# Patient Record
Sex: Male | Born: 1975 | Race: White | Hispanic: No | Marital: Married | Smoking: Never smoker
Health system: Southern US, Community
[De-identification: ages and names within clinical notes are randomized; demographics above are authoritative.]

## PROBLEM LIST (undated history)

## (undated) DIAGNOSIS — R011 Cardiac murmur, unspecified: Secondary | ICD-10-CM

## (undated) DIAGNOSIS — B009 Herpesviral infection, unspecified: Secondary | ICD-10-CM

## (undated) HISTORY — DX: Cardiac murmur, unspecified: R01.1

## (undated) HISTORY — DX: Herpesviral infection, unspecified: B00.9

---

## 2003-03-26 ENCOUNTER — Encounter: Payer: Self-pay | Admitting: Family Medicine

## 2006-07-07 ENCOUNTER — Encounter: Payer: Self-pay | Admitting: Family Medicine

## 2007-08-25 ENCOUNTER — Ambulatory Visit: Payer: Self-pay | Admitting: Family Medicine

## 2007-08-25 DIAGNOSIS — N453 Epididymo-orchitis: Secondary | ICD-10-CM | POA: Insufficient documentation

## 2007-08-25 DIAGNOSIS — R531 Weakness: Secondary | ICD-10-CM | POA: Insufficient documentation

## 2007-08-25 DIAGNOSIS — R5381 Other malaise: Secondary | ICD-10-CM

## 2007-08-25 DIAGNOSIS — R5383 Other fatigue: Secondary | ICD-10-CM | POA: Insufficient documentation

## 2007-08-25 DIAGNOSIS — R300 Dysuria: Secondary | ICD-10-CM | POA: Insufficient documentation

## 2007-08-25 LAB — CONVERTED CEMR LAB
Casts: 0 /lpf
Nitrite: NEGATIVE
Urine crystals, microscopic: 0 /hpf
Urobilinogen, UA: NEGATIVE
WBC Urine, dipstick: NEGATIVE
Yeast, UA: 0
pH: 7

## 2007-08-29 ENCOUNTER — Ambulatory Visit: Payer: Self-pay | Admitting: Family Medicine

## 2007-08-30 ENCOUNTER — Encounter: Payer: Self-pay | Admitting: Family Medicine

## 2007-08-31 LAB — CONVERTED CEMR LAB
Alkaline Phosphatase: 44 units/L (ref 39–117)
BUN: 20 mg/dL (ref 6–23)
Basophils Relative: 0 % (ref 0.0–1.0)
Bilirubin, Direct: 0.1 mg/dL (ref 0.0–0.3)
CO2: 28 meq/L (ref 19–32)
Cholesterol: 160 mg/dL (ref 0–200)
GFR calc Af Amer: 100 mL/min
Glucose, Bld: 95 mg/dL (ref 70–99)
HDL: 35.5 mg/dL — ABNORMAL LOW (ref >39.0)
Hemoglobin: 15.2 g/dL (ref 13.0–17.0)
Lymphocytes Relative: 37.4 % (ref 12.0–46.0)
MCHC: 35 g/dL (ref 30.0–36.0)
MCV: 91.1 fL (ref 78.0–100.0)
Monocytes Absolute: 0.4 10*3/uL (ref 0.2–0.7)
Monocytes Relative: 7.6 % (ref 3.0–11.0)
Neutro Abs: 2.8 10*3/uL (ref 1.4–7.7)
Potassium: 4.3 meq/L (ref 3.5–5.1)
TSH: 1.61 microintl units/mL (ref 0.35–5.50)
Total Protein: 6.9 g/dL (ref 6.0–8.3)
VLDL: 10 mg/dL (ref 0–40)
Vitamin B-12: 246 pg/mL (ref 211–911)

## 2007-09-01 ENCOUNTER — Ambulatory Visit: Payer: Self-pay | Admitting: Family Medicine

## 2007-09-02 ENCOUNTER — Encounter: Payer: Self-pay | Admitting: Family Medicine

## 2008-07-18 ENCOUNTER — Encounter: Payer: Self-pay | Admitting: Family Medicine

## 2008-08-06 ENCOUNTER — Encounter: Payer: Self-pay | Admitting: Family Medicine

## 2010-06-05 ENCOUNTER — Ambulatory Visit: Payer: Self-pay | Admitting: Family Medicine

## 2010-06-05 DIAGNOSIS — F329 Major depressive disorder, single episode, unspecified: Secondary | ICD-10-CM | POA: Insufficient documentation

## 2010-06-05 DIAGNOSIS — F3289 Other specified depressive episodes: Secondary | ICD-10-CM | POA: Insufficient documentation

## 2010-07-09 ENCOUNTER — Ambulatory Visit: Payer: Self-pay | Admitting: Family Medicine

## 2010-11-10 NOTE — Assessment & Plan Note (Signed)
Summary: STRESS/CLE   Vital Signs:  Patient profile:   35 year old male Height:      72 inches Weight:      191.6 pounds BMI:     26.08 Temp:     98.5 degrees F oral Pulse rate:   76 / minute Pulse rhythm:   regular BP sitting:   120 / 78  (left arm) Cuff size:   regular  Vitals Entered By: Benny Lennert CMA Duncan Dull) (June 05, 2010 8:55 AM)  History of Present Illness: Chief complaint stress  Feels depressed some. About two years ago lost job at [employer]. Since then and now staying home with three kids. Almost getting a divorce. Crying a lot. Does not want to do anything. Does not really care. Slowed cognitively. Poor recall. Low self esteem. Massage therapost. Not sleeping well at all. Lately getting asleep is very hard. Not working out much.   A couple of drinks a month.   Was ranked eight in the world in th epast. [place].   Has an appointment to see a psychologist next week.   Allergies (verified): No Known Drug Allergies  Past History:  Past medical, surgical, family and social histories (including risk factors) reviewed, and no changes noted (except as noted below).  Past Surgical History: Reviewed history from 08/25/2007 and no changes required. barium swallow nml  2003 CT of abdomen nml 2003  Family History: Reviewed history from 08/25/2007 and no changes required. father age 44 carotid stenosis mother age 82 high chol, HTN, basal cell Ca siblings: brother MI age 41 Family History of CAD Male 1st degree relative <50 MGM: Alzheimer's MGF: heart disease PGF: DM PGM: heallthy no cancer  Social History: Reviewed history from 08/25/2007 and no changes required. 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  We are going to start an antidepressant. He Artie is set up with a psychologist in counseling. Follow up in one month.  His updated medication list for this problem includes:    Citalopram Hydrobromide 20 Mg Tabs (Citalopram hydrobromide) .Marland Kitchen... 1 by mouth daily  Complete Medication List: 1)  Citalopram Hydrobromide 20 Mg Tabs (Citalopram hydrobromide) .Marland Kitchen.. 1 by mouth daily  Patient Instructions: 1)  f/u 1 month (with Dr. Patsy Lager) Prescriptions: CITALOPRAM HYDROBROMIDE 20 MG TABS (CITALOPRAM HYDROBROMIDE) 1 by mouth daily  #30 x 5   Entered and Authorized by:   Hannah Beat MD   Signed by:   Hannah Beat MD on 06/05/2010   Method used:   [facility] to        [facility]  [address] (retail)       [address]       [place], [place]  [address]       Ph: [phone] or [phone]       Fax: [phone]   RxID:   [id number]   Prior Medications (reviewed today): None Current Allergies (reviewed today): No known allergies

## 2010-11-10 NOTE — Assessment & Plan Note (Signed)
Summary: 1 M F/U DLO   Vital Signs:  Patient profile:   35 year old male Height:      72 inches Weight:      192.0 pounds BMI:     26.13 Temp:     98.2 degrees F oral Pulse rate:   76 / minute Pulse rhythm:   regular BP sitting:   120 / 80  (left arm) Cuff size:   regular  Vitals Entered By: Benny Lennert CMA Duncan Dull) (July 09, 2010 9:25 AM)  History of Present Illness: Chief complaint 1 month follow up   f/u dep:  sleeping through the night does feel sleepy some during the day    Allergies (verified): No Known Drug Allergies  Past History:  Past medical, surgical, family and social histories (including risk factors) reviewed, and no changes noted (except as noted below).  Past Surgical History: Reviewed history from 08/25/2007 and no changes required. barium swallow nml  2003 CT of abdomen nml 2003  Family History: Reviewed history from 08/25/2007 and no changes required. father age 79 carotid stenosis mother age 19 high chol, HTN, basal cell Ca siblings: brother MI age 55 Family History of CAD Male 1st degree relative <50 MGM: Alzheimer's MGF: heart disease PGF: DM PGM: heallthy no cancer  Social History: Reviewed history from 08/25/2007 and no changes required. 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 Getting sleepy in AM. No counselling. Trial of lower Celexa dosing, 10 mg based on SE, sleep. Patient encouraged about changes. No si/hi. More directed. Less irritable.  His updated medication list for this problem includes:    Citalopram Hydrobromide  10 Mg Tabs (Citalopram hydrobromide) .Marland Kitchen... Take 1 tab by mouth daily  Complete Medication List: 1)  Citalopram Hydrobromide 10 Mg Tabs (Citalopram hydrobromide) .... Take 1 tab by mouth daily  Patient Instructions: 1)  f/u at least yearly Prescriptions: CITALOPRAM HYDROBROMIDE 10 MG  TABS (CITALOPRAM HYDROBROMIDE) Take 1 tab by mouth daily  #30 x 11   Entered and Authorized by:   Hannah Beat MD   Signed by:   Hannah Beat MD on 07/09/2010   Method used:   Print then Give to Patient   RxID:   [id number]   Current Allergies (reviewed today): No known allergies

## 2012-11-15 ENCOUNTER — Ambulatory Visit
Admission: RE | Admit: 2012-11-15 | Discharge: 2012-11-15 | Disposition: A | Discharge status: Home / Self Care | Payer: BC Managed Care – PPO | Source: Ambulatory Visit | Attending: Nurse Practitioner | Admitting: Nurse Practitioner

## 2012-11-15 ENCOUNTER — Other Ambulatory Visit: Payer: Self-pay | Admitting: Nurse Practitioner

## 2012-11-15 DIAGNOSIS — R05 Cough: Secondary | ICD-10-CM

## 2020-04-04 ENCOUNTER — Telehealth: Payer: Self-pay

## 2020-04-04 NOTE — Telephone Encounter (Signed)
Pt called to advise he still has not heard from [facility]. We have no other place to send him and they are back logged. Please advise if pt can have pain med or advise on comfort wile he waits. KH

## 2020-04-25 ENCOUNTER — Emergency Department (HOSPITAL_COMMUNITY): Payer: BC Managed Care – PPO

## 2020-04-25 ENCOUNTER — Other Ambulatory Visit: Payer: Self-pay

## 2020-04-25 ENCOUNTER — Encounter (HOSPITAL_COMMUNITY): Payer: Self-pay

## 2020-04-25 DIAGNOSIS — Z20822 Contact with and (suspected) exposure to covid-19: Secondary | ICD-10-CM | POA: Diagnosis not present

## 2020-04-25 DIAGNOSIS — R079 Chest pain, unspecified: Secondary | ICD-10-CM | POA: Insufficient documentation

## 2020-04-25 DIAGNOSIS — R1310 Dysphagia, unspecified: Secondary | ICD-10-CM | POA: Diagnosis not present

## 2020-04-25 NOTE — ED Triage Notes (Signed)
Pt sts eating raibs last night and has bone or cartilage stuck in throat. Pain in throat and has moved to lungs per pt.

## 2020-04-26 ENCOUNTER — Emergency Department (HOSPITAL_COMMUNITY)
Admission: EM | Admit: 2020-04-26 | Discharge: 2020-04-26 | Disposition: A | Discharge status: Home / Self Care | Payer: BC Managed Care – PPO | Attending: Emergency Medicine | Admitting: Emergency Medicine

## 2020-04-26 ENCOUNTER — Emergency Department (HOSPITAL_COMMUNITY): Payer: BC Managed Care – PPO

## 2020-04-26 ENCOUNTER — Encounter (HOSPITAL_COMMUNITY): Payer: Self-pay

## 2020-04-26 DIAGNOSIS — R1319 Other dysphagia: Secondary | ICD-10-CM

## 2020-04-26 LAB — CBC WITH DIFFERENTIAL/PLATELET
Abs Immature Granulocytes: 0.05 10*3/uL (ref 0.00–0.07)
Basophils Absolute: 0 10*3/uL (ref 0.0–0.1)
Basophils Relative: 0 %
Eosinophils Absolute: 0.2 10*3/uL (ref 0.0–0.5)
Eosinophils Relative: 2 %
HCT: 45.8 % (ref 39.0–52.0)
Hemoglobin: 15.2 g/dL (ref 13.0–17.0)
Immature Granulocytes: 1 %
Lymphocytes Relative: 26 %
Lymphs Abs: 2.4 10*3/uL (ref 0.7–4.0)
MCH: 30 pg (ref 26.0–34.0)
MCHC: 33.2 g/dL (ref 30.0–36.0)
MCV: 90.5 fL (ref 80.0–100.0)
Monocytes Absolute: 0.8 10*3/uL (ref 0.1–1.0)
Monocytes Relative: 8 %
Neutro Abs: 5.7 10*3/uL (ref 1.7–7.7)
Neutrophils Relative %: 63 %
Platelets: 219 10*3/uL (ref 150–400)
RBC: 5.06 MIL/uL (ref 4.22–5.81)
RDW: 13.3 % (ref 11.5–15.5)
WBC: 9.2 10*3/uL (ref 4.0–10.5)
nRBC: 0 % (ref 0.0–0.2)

## 2020-04-26 LAB — BASIC METABOLIC PANEL
Anion gap: 11 (ref 5–15)
BUN: 17 mg/dL (ref 6–20)
CO2: 26 mmol/L (ref 22–32)
Calcium: 9.3 mg/dL (ref 8.9–10.3)
Chloride: 101 mmol/L (ref 98–111)
Creatinine, Ser: 0.94 mg/dL (ref 0.61–1.24)
GFR calc Af Amer: >60 mL/min (ref >60)
GFR calc non Af Amer: >60 mL/min (ref >60)
Glucose, Bld: 99 mg/dL (ref 70–99)
Potassium: 3.7 mmol/L (ref 3.5–5.1)
Sodium: 138 mmol/L (ref 135–145)

## 2020-04-26 LAB — SARS CORONAVIRUS 2 BY RT PCR (HOSPITAL ORDER, PERFORMED IN ~~LOC~~ HOSPITAL LAB): SARS Coronavirus 2: NEGATIVE

## 2020-04-26 SURGERY — ESOPHAGOGASTRODUODENOSCOPY (EGD) WITH PROPOFOL
Anesthesia: Monitor Anesthesia Care

## 2020-04-26 MED ORDER — ALUM & MAG HYDROXIDE-SIMETH 200-200-20 MG/5ML PO SUSP
30.0000 mL | Freq: Once | ORAL | Status: AC
  Administered 2020-04-26: 30 mL via ORAL
  Filled 2020-04-26: qty 30

## 2020-04-26 MED ORDER — LIDOCAINE VISCOUS HCL 2 % MT SOLN
15.0000 mL | Freq: Once | OROMUCOSAL | Status: AC
  Administered 2020-04-26: 15 mL via ORAL
  Filled 2020-04-26: qty 15

## 2020-04-26 MED ORDER — IOHEXOL 300 MG/ML  SOLN
30.0000 mL | Freq: Once | INTRAMUSCULAR | Status: AC | PRN
  Administered 2020-04-26: 30 mL via ORAL

## 2020-04-26 MED ORDER — GLUCAGON HCL RDNA (DIAGNOSTIC) 1 MG IJ SOLR
1.0000 mg | Freq: Once | INTRAMUSCULAR | Status: DC | PRN
  Filled 2020-04-26: qty 1

## 2020-04-26 NOTE — Discharge Instructions (Addendum)
You have opted not to have the recommended endoscopy to verify there is no foreign body (ie bone fragment) embedded in the esophageal wall. We have talked about the potential complications including infection, and/or erosion/perforation of the esophagus, which could become life threatening if not treated. Please know you are welcome to return at any time with any concern. You are encouraged to follow up with Dr. Matthias Hughs in his office if your pain continues longer than expected from a minor esophageal injury. Recommend a diet of liquids and soft foods, that can be advanced as symptoms improve.

## 2020-04-26 NOTE — ED Provider Notes (Addendum)
[facility]-EMERGENCY DEPT Provider Note   CSN: [id number] Arrival date & time: 04/25/20  2125     History Chief Complaint  Patient presents with  . Foreign Body In Throat    Adam Kaiser is a 44 y.o. male.  Patient without significant medical history presents with chest pain after swallowing a piece of bone or cartilage while eating ribs night before last (04/23/20). He gagged and choked at the time but did not regurgitate anything. He has been able to drink fluids but has not eaten. He did try to eat some bread but feels it became lodge. No vomiting. His pain has gotten progressively worse throughout yesterday, with intense pain when coughing. No shortness of breath or pain with breathing.   The history is provided by the patient. No language interpreter was used.       History reviewed. No pertinent past medical history.  Patient Active Problem List   Diagnosis Date Noted  . DEPRESSIVE DISORDER 06/05/2010  . EPIDIDYMITIS, LEFT 08/25/2007  . FATIGUE, CHRONIC 08/25/2007  . DYSURIA 08/25/2007    History reviewed. No pertinent surgical history.     No family history on file.  Social History   Tobacco Use  . Smoking status: Not on file  Substance Use Topics  . Alcohol use: Not on file  . Drug use: Not on file    Home Medications Prior to Admission medications   Not on File    Allergies    Patient has no known allergies.  Review of Systems   Review of Systems  Constitutional: Negative for chills and fever.  HENT: Negative.  Negative for sore throat and trouble swallowing.   Respiratory: Negative.  Negative for shortness of breath.   Cardiovascular: Positive for chest pain.  Gastrointestinal: Negative.  Negative for abdominal pain, nausea and vomiting.  Musculoskeletal: Negative.   Skin: Negative.   Neurological: Negative.     Physical Exam Updated Vital Signs BP (!) 150/99 (BP Location: Left Arm)   Pulse 69   Temp 97.7 F (36.5  C) (Oral)   Resp 20   Ht 6' (1.829 m)   Wt 104.3 kg   SpO2 97%   BMI 31.19 kg/m   Physical Exam Vitals and nursing note reviewed.  Constitutional:      Appearance: He is well-developed.  HENT:     Head: Normocephalic.  Cardiovascular:     Rate and Rhythm: Normal rate and regular rhythm.  Pulmonary:     Effort: Pulmonary effort is normal.     Breath sounds: Normal breath sounds. No stridor. No wheezing, rhonchi or rales.  Chest:     Chest wall: No tenderness.  Abdominal:     General: Bowel sounds are normal.     Palpations: Abdomen is soft.     Tenderness: There is no abdominal tenderness. There is no guarding or rebound.  Musculoskeletal:        General: Normal range of motion.     Cervical back: Normal range of motion and neck supple.  Skin:    General: Skin is warm and dry.     Findings: No rash.  Neurological:     Mental Status: He is alert and oriented to person, place, and time.     ED Results / Procedures / Treatments   Labs (all labs ordered are listed, but only abnormal results are displayed) Labs Reviewed - No data to display  EKG None  Radiology DG Neck Soft Tissue  Result Date: 04/25/2020  CLINICAL DATA:  Short of breath, postprandial sternal and neck pain for 1 day EXAM: NECK SOFT TISSUES - 1+ VIEW COMPARISON:  None. FINDINGS: Frontal and lateral views of the soft tissues of the neck demonstrate wide patency of the airway. The epiglottis is unremarkable. The prevertebral and retropharyngeal soft tissues are normal. There are no radiopaque foreign bodies. Bony structures are normal. IMPRESSION: 1. Unremarkable soft tissues of the neck. No radiopaque foreign body. Electronically Signed   By: Sharlet Salina M.D.   On: 04/25/2020 22:11   DG Chest 2 View  Result Date: 04/25/2020 CLINICAL DATA:  Short of breath, sternal postprandial pain EXAM: CHEST - 2 VIEW COMPARISON:  11/15/2012 FINDINGS: Frontal and lateral views of the chest demonstrate an unremarkable  cardiac silhouette. No airspace disease, effusion, or pneumothorax. No radiopaque foreign bodies. No acute bony abnormalities. IMPRESSION: 1. No acute intrathoracic process. Electronically Signed   By: Sharlet Salina M.D.   On: 04/25/2020 22:10    Procedures Procedures (including critical care time)  Medications Ordered in ED Medications  alum & mag hydroxide-simeth (MAALOX/MYLANTA) 200-200-20 MG/5ML suspension 30 mL (has no administration in time range)    And  lidocaine (XYLOCAINE) 2 % viscous mouth solution 15 mL (has no administration in time range)    ED Course  I have reviewed the triage vital signs and the nursing notes.  Pertinent labs & imaging results that were available during my care of the patient were reviewed by me and considered in my medical decision making (see chart for details).    MDM Rules/Calculators/A&P                          Patient to ED with chest pain and the feeling of a retained swallowed foreign body.  He is uncomfortable appearing with cough. No breathing difficulty. No nausea or vomiting. Symptoms started 24 hours ago and he has been able to swallow pasta without vomiting, but with pain. CXR and soft tissue neck imaging is unremarkable.   GI cocktail provided without significant relief.  DDx: esophageal perforation vs retained bone fragment vs luminal esophageal injury. Will discuss with GI on call, Dr. Matthias Hughs.   CT chest recommended by GI to evaluate for perforation, FB. Per radiology, will not use IV contrast but oral CM is recommended. This study does not show any evidence of perforation, esophageal mass or FB.   Per Dr. Matthias Hughs, will get EGD to insure no lodged bone fragment. Given ss/sxs, CT results, it is felt unlikely, however, EGD indicated because of the potential complications from retained or embedded FB. Patient agrees to have the procedure performed. COVID test pending. Will call Dr. Matthias Hughs when test is resulted.   Dr. Matthias Hughs aware of  negative COVID test and will be in with his endoscopy team to perform EGD.   ADDENDUM: on final recheck and update with the patient, he states he no longer wants to have to EGD done this morning. I reiterated that the study is recommended as the potential complications from an embedded FB, including infection and/or erosion and perforation of the esophagus, are significant. The patient reports he understands. Discussed that follow up with GI/Dr. Buccini is available to him in the event his pain continues, and return to the ED for severe pain, fever, SOB, or new concern is also an option open to him.     Final Clinical Impression(s) / ED Diagnoses Final diagnoses:  None   1. Dysphagia  Rx /  DC Orders ED Discharge Orders    None       Elpidio Anis, Cordelia Poche 04/26/20 0719    Ward, Layla Maw, DO 04/26/20 0720    Elpidio Anis, PA-C 04/26/20 0732    Ward, Layla Maw, DO 04/26/20 [phone]

## 2021-01-31 ENCOUNTER — Encounter (HOSPITAL_COMMUNITY): Payer: Self-pay

## 2021-01-31 ENCOUNTER — Emergency Department (HOSPITAL_COMMUNITY): Payer: BC Managed Care – PPO

## 2021-01-31 ENCOUNTER — Emergency Department (HOSPITAL_COMMUNITY)
Admission: EM | Admit: 2021-01-31 | Discharge: 2021-01-31 | Disposition: A | Discharge status: Home / Self Care | Payer: BC Managed Care – PPO | Attending: Emergency Medicine | Admitting: Emergency Medicine

## 2021-01-31 DIAGNOSIS — J029 Acute pharyngitis, unspecified: Secondary | ICD-10-CM | POA: Insufficient documentation

## 2021-01-31 DIAGNOSIS — Z20822 Contact with and (suspected) exposure to covid-19: Secondary | ICD-10-CM | POA: Insufficient documentation

## 2021-01-31 LAB — COMPREHENSIVE METABOLIC PANEL
ALT: 31 U/L (ref 0–44)
AST: 21 U/L (ref 15–41)
Albumin: 4.5 g/dL (ref 3.5–5.0)
Alkaline Phosphatase: 43 U/L (ref 38–126)
Anion gap: 11 (ref 5–15)
BUN: 23 mg/dL — ABNORMAL HIGH (ref 6–20)
CO2: 22 mmol/L (ref 22–32)
Calcium: 9.4 mg/dL (ref 8.9–10.3)
Chloride: 110 mmol/L (ref 98–111)
Creatinine, Ser: 1.08 mg/dL (ref 0.61–1.24)
GFR, Estimated: >60 mL/min (ref >60)
Glucose, Bld: 97 mg/dL (ref 70–99)
Potassium: 4.1 mmol/L (ref 3.5–5.1)
Sodium: 143 mmol/L (ref 135–145)
Total Bilirubin: 0.7 mg/dL (ref 0.3–1.2)
Total Protein: 7.8 g/dL (ref 6.5–8.1)

## 2021-01-31 LAB — CBC WITH DIFFERENTIAL/PLATELET
Abs Immature Granulocytes: 0.05 10*3/uL (ref 0.00–0.07)
Basophils Absolute: 0.1 10*3/uL (ref 0.0–0.1)
Basophils Relative: 0 %
Eosinophils Absolute: 0.1 10*3/uL (ref 0.0–0.5)
Eosinophils Relative: 1 %
HCT: 43.9 % (ref 39.0–52.0)
Hemoglobin: 14.9 g/dL (ref 13.0–17.0)
Immature Granulocytes: 0 %
Lymphocytes Relative: 21 %
Lymphs Abs: 2.5 10*3/uL (ref 0.7–4.0)
MCH: 30.2 pg (ref 26.0–34.0)
MCHC: 33.9 g/dL (ref 30.0–36.0)
MCV: 88.9 fL (ref 80.0–100.0)
Monocytes Absolute: 0.8 10*3/uL (ref 0.1–1.0)
Monocytes Relative: 7 %
Neutro Abs: 8.2 10*3/uL — ABNORMAL HIGH (ref 1.7–7.7)
Neutrophils Relative %: 71 %
Platelets: 223 10*3/uL (ref 150–400)
RBC: 4.94 MIL/uL (ref 4.22–5.81)
RDW: 13.1 % (ref 11.5–15.5)
WBC: 11.7 10*3/uL — ABNORMAL HIGH (ref 4.0–10.5)
nRBC: 0 % (ref 0.0–0.2)

## 2021-01-31 LAB — RESP PANEL BY RT-PCR (FLU A&B, COVID) ARPGX2
Influenza A by PCR: NEGATIVE
Influenza B by PCR: NEGATIVE
SARS Coronavirus 2 by RT PCR: NEGATIVE

## 2021-01-31 LAB — GROUP A STREP BY PCR: Group A Strep by PCR: NOT DETECTED

## 2021-01-31 MED ORDER — IOHEXOL 300 MG/ML  SOLN
75.0000 mL | Freq: Once | INTRAMUSCULAR | Status: AC | PRN
  Administered 2021-01-31: 75 mL via INTRAVENOUS

## 2021-01-31 MED ORDER — PROCHLORPERAZINE EDISYLATE 10 MG/2ML IJ SOLN
10.0000 mg | Freq: Once | INTRAMUSCULAR | Status: AC
  Administered 2021-01-31: 10 mg via INTRAVENOUS
  Filled 2021-01-31: qty 2

## 2021-01-31 MED ORDER — LIDOCAINE VISCOUS HCL 2 % MT SOLN
15.0000 mL | Freq: Once | OROMUCOSAL | Status: AC
  Administered 2021-01-31: 15 mL via OROMUCOSAL
  Filled 2021-01-31: qty 15

## 2021-01-31 MED ORDER — KETOROLAC TROMETHAMINE 30 MG/ML IJ SOLN
30.0000 mg | Freq: Once | INTRAMUSCULAR | Status: AC
  Administered 2021-01-31: 30 mg via INTRAVENOUS
  Filled 2021-01-31: qty 1

## 2021-01-31 MED ORDER — DIPHENHYDRAMINE HCL 50 MG/ML IJ SOLN
25.0000 mg | Freq: Once | INTRAMUSCULAR | Status: AC
  Administered 2021-01-31: 25 mg via INTRAVENOUS
  Filled 2021-01-31: qty 1

## 2021-01-31 MED ORDER — DEXAMETHASONE SODIUM PHOSPHATE 10 MG/ML IJ SOLN
16.0000 mg | Freq: Once | INTRAMUSCULAR | Status: AC
  Administered 2021-01-31: 16 mg via INTRAVENOUS
  Filled 2021-01-31: qty 2

## 2021-01-31 NOTE — ED Provider Notes (Signed)
[facility]-EMERGENCY DEPT Provider Note   CSN: [id number] Arrival date & time: 01/31/21  1514     History Chief Complaint  Patient presents with  . Dysphagia    Adam Kaiser is a 45 y.o. male.  HPI      45yo male with no significant medical history presents with concern for sore throat, neck pain, painful swallowing, and shortness of breath. Reports that he began to have sensation of swelling and stiffness across his anterior neck over the last couple days, and today developed more severe sore throat.  Reports severe, 10 out of 10 pain with swallowing, and also with neck movements which she has never experienced before.  He is not drooling, does not feel that his voice is significantly different.  He does feel that he has some shortness of breath.  He denies known fevers, nausea, vomiting, rash with the exception of one small area on his calf which was present yesterday and resolved.  Denies any history of allergies or known exposures.   History reviewed. No pertinent past medical history.  Patient Active Problem List   Diagnosis Date Noted  . DEPRESSIVE DISORDER 06/05/2010  . EPIDIDYMITIS, LEFT 08/25/2007  . FATIGUE, CHRONIC 08/25/2007  . DYSURIA 08/25/2007    History reviewed. No pertinent surgical history.     History reviewed. No pertinent family history.  Social History   Tobacco Use  . Smoking status: Never Smoker  . Smokeless tobacco: Never Used    Home Medications Prior to Admission medications   Not on File    Allergies    Patient has no known allergies.  Review of Systems   Review of Systems  Constitutional: Negative for fever.  HENT: Positive for sore throat and trouble swallowing. Negative for congestion and voice change.   Eyes: Negative for visual disturbance.  Respiratory: Positive for shortness of breath.   Cardiovascular: Negative for chest pain.  Gastrointestinal: Negative for abdominal pain, nausea and vomiting.   Genitourinary: Negative for difficulty urinating.  Musculoskeletal: Positive for neck pain and neck stiffness. Negative for back pain.  Skin: Negative for rash.  Neurological: Positive for headaches. Negative for syncope.    Physical Exam Updated Vital Signs BP 131/85   Pulse (!) 104   Temp 98.1 F (36.7 C)   Resp 16   SpO2 94%   Physical Exam Vitals and nursing note reviewed.  Constitutional:      General: He is not in acute distress.    Appearance: He is well-developed. He is not diaphoretic.  HENT:     Head: Normocephalic and atraumatic.     Mouth/Throat:     Mouth: Mucous membranes are moist.     Pharynx: No oropharyngeal exudate or posterior oropharyngeal erythema.  Eyes:     Conjunctiva/sclera: Conjunctivae normal.  Cardiovascular:     Rate and Rhythm: Normal rate and regular rhythm.     Heart sounds: Normal heart sounds. No murmur heard. No friction rub. No gallop.   Pulmonary:     Effort: Pulmonary effort is normal. No respiratory distress.     Breath sounds: Normal breath sounds. No stridor. No wheezing or rales.  Abdominal:     General: There is no distension.     Palpations: Abdomen is soft.     Tenderness: There is no abdominal tenderness. There is no guarding.  Musculoskeletal:     Cervical back: Normal range of motion.  Skin:    General: Skin is warm and dry.  Neurological:  Mental Status: He is alert and oriented to person, place, and time.     ED Results / Procedures / Treatments   Labs (all labs ordered are listed, but only abnormal results are displayed) Labs Reviewed  CBC WITH DIFFERENTIAL/PLATELET - Abnormal; Notable for the following components:      Result Value   WBC 11.7 (*)    Neutro Abs 8.2 (*)    All other components within normal limits  COMPREHENSIVE METABOLIC PANEL - Abnormal; Notable for the following components:   BUN 23 (*)    All other components within normal limits  GROUP A STREP BY PCR  RESP PANEL BY RT-PCR (FLU  A&B, COVID) [id number]    EKG None  Radiology CT Soft Tissue Neck W Contrast  Result Date: 01/31/2021 CLINICAL DATA:  Difficulty swallowing. Speaking in partial sentences. EXAM: CT NECK WITH CONTRAST TECHNIQUE: Multidetector CT imaging of the neck was performed using the standard protocol following the bolus administration of intravenous contrast. CONTRAST:  34mL OMNIPAQUE IOHEXOL 300 MG/ML  SOLN COMPARISON:  None. FINDINGS: Pharynx and larynx: No evidence of mass or swelling. Mild-to-moderate symmetric prominence of the palatine tonsils. Widely patent airway. No fluid collection or significant inflammatory changes in the parapharyngeal or retropharyngeal spaces. Salivary glands: No inflammation, mass, or stone. Thyroid: Unremarkable. Lymph nodes: Mildly prominent subcentimeter short axis lymph nodes scattered throughout the neck bilaterally, nonspecific though presumably reactive. Vascular: Unremarkable. Limited intracranial: Large area of geographic low density involving the left cerebellum which may reflect a chronic infarct versus an arachnoid cyst with hypoplastic left cerebellar hemisphere. Visualized orbits: Unremarkable. Mastoids and visualized paranasal sinuses: Clear. Skeleton: No acute osseous abnormality or suspicious osseous lesion. Upper chest: Clear lung apices. Other: None. IMPRESSION: 1. No acute abnormality identified in the neck. 2. 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 Electronically Signed   By: Sebastian Ache M.D.   On: 01/31/2021 19:07    Procedures Procedures   Medications Ordered in ED Medications  dexamethasone (DECADRON) injection 16 mg (16 mg Intravenous Given 01/31/21 1636)  lidocaine (XYLOCAINE) 2 % viscous mouth solution 15 mL (15 mLs Mouth/Throat Given 01/31/21 1639)  prochlorperazine (COMPAZINE) injection 10 mg (10 mg Intravenous Given 01/31/21 1717)  diphenhydrAMINE (BENADRYL) injection 25 mg (25 mg Intravenous Given 01/31/21 1717)   iohexol (OMNIPAQUE) 300 MG/ML solution 75 mL (75 mLs Intravenous Contrast Given 01/31/21 1816)  ketorolac (TORADOL) 30 MG/ML injection 30 mg (30 mg Intravenous Given 01/31/21 2022)    ED Course  I have reviewed the triage vital signs and the nursing notes.  Pertinent labs & imaging results that were available during my care of the patient were reviewed by me and considered in my medical decision making (see chart for details).    MDM Rules/Calculators/A&P                          45yo male with no significant medical history presents with concern for sore throat, neck pain, painful swallowing, and shortness of breath.  Do not see evidence of peritonsillar abscess on exam. No stridor, no hoarse voice, no drooling, airway intact.     Given symptoms and significant pain, CT ordered to evaluate for signs of RPA, eplottitis or other abnormalities.  Given 16mg  decadron, IV fluids.   CT shows no sign of abscess. Mildly prominent subcentimeter short axis lymph nodes, mild-moderate symmetric prominence of the tonsils.   Reports significant pain with swallowing, history not consistent with cardiac  pain/PE given.. Location pharyngeal and doubt significant esophageal abnormality.   Strep/flu/covid testing negative. Suspect other viral pharyngitis, recommend continued supportive care. Patient discharged in stable condition with understanding of reasons to return.      Final Clinical Impression(s) / ED Diagnoses Final diagnoses:  Pharyngitis, unspecified etiology    Rx / DC Orders ED Discharge Orders    None       Alvira Monday, MD 02/01/21 1106

## 2021-01-31 NOTE — ED Triage Notes (Signed)
Pt arrived via walk in, c/o trouble swallowing that is creating trouble breathing. Denies any trauma to area. States started last night. Speaking in partial sentences. Spo2 99% RA. Airway patent.

## 2021-06-12 ENCOUNTER — Encounter (HOSPITAL_BASED_OUTPATIENT_CLINIC_OR_DEPARTMENT_OTHER): Payer: Self-pay | Admitting: *Deleted

## 2021-06-12 ENCOUNTER — Emergency Department (HOSPITAL_BASED_OUTPATIENT_CLINIC_OR_DEPARTMENT_OTHER): Payer: BC Managed Care – PPO

## 2021-06-12 ENCOUNTER — Other Ambulatory Visit (HOSPITAL_BASED_OUTPATIENT_CLINIC_OR_DEPARTMENT_OTHER): Payer: Self-pay

## 2021-06-12 ENCOUNTER — Emergency Department (HOSPITAL_BASED_OUTPATIENT_CLINIC_OR_DEPARTMENT_OTHER)
Admission: EM | Admit: 2021-06-12 | Discharge: 2021-06-12 | Disposition: A | Discharge status: Home / Self Care | Payer: BC Managed Care – PPO | Attending: Emergency Medicine | Admitting: Emergency Medicine

## 2021-06-12 ENCOUNTER — Other Ambulatory Visit: Payer: Self-pay

## 2021-06-12 DIAGNOSIS — R1032 Left lower quadrant pain: Secondary | ICD-10-CM | POA: Diagnosis present

## 2021-06-12 DIAGNOSIS — R1030 Lower abdominal pain, unspecified: Secondary | ICD-10-CM

## 2021-06-12 LAB — URINALYSIS, ROUTINE W REFLEX MICROSCOPIC
Bilirubin Urine: NEGATIVE
Glucose, UA: NEGATIVE mg/dL
Hgb urine dipstick: NEGATIVE
Ketones, ur: NEGATIVE mg/dL
Leukocytes,Ua: NEGATIVE
Nitrite: NEGATIVE
Protein, ur: NEGATIVE mg/dL
Specific Gravity, Urine: 1.021 (ref 1.005–1.030)
pH: 6 (ref 5.0–8.0)

## 2021-06-12 LAB — CBC
HCT: 46.8 % (ref 39.0–52.0)
Hemoglobin: 15.5 g/dL (ref 13.0–17.0)
MCH: 29.5 pg (ref 26.0–34.0)
MCHC: 33.1 g/dL (ref 30.0–36.0)
MCV: 89.1 fL (ref 80.0–100.0)
Platelets: 245 10*3/uL (ref 150–400)
RBC: 5.25 MIL/uL (ref 4.22–5.81)
RDW: 13.2 % (ref 11.5–15.5)
WBC: 8.9 10*3/uL (ref 4.0–10.5)
nRBC: 0 % (ref 0.0–0.2)

## 2021-06-12 LAB — COMPREHENSIVE METABOLIC PANEL
ALT: 25 U/L (ref 0–44)
AST: 14 U/L — ABNORMAL LOW (ref 15–41)
Albumin: 4.4 g/dL (ref 3.5–5.0)
Alkaline Phosphatase: 47 U/L (ref 38–126)
Anion gap: 9 (ref 5–15)
BUN: 26 mg/dL — ABNORMAL HIGH (ref 6–20)
CO2: 26 mmol/L (ref 22–32)
Calcium: 9.4 mg/dL (ref 8.9–10.3)
Chloride: 104 mmol/L (ref 98–111)
Creatinine, Ser: 1.28 mg/dL — ABNORMAL HIGH (ref 0.61–1.24)
GFR, Estimated: >60 mL/min (ref >60)
Glucose, Bld: 107 mg/dL — ABNORMAL HIGH (ref 70–99)
Potassium: 4.4 mmol/L (ref 3.5–5.1)
Sodium: 139 mmol/L (ref 135–145)
Total Bilirubin: 0.5 mg/dL (ref 0.3–1.2)
Total Protein: 7.5 g/dL (ref 6.5–8.1)

## 2021-06-12 LAB — LIPASE, BLOOD: Lipase: 24 U/L (ref 11–51)

## 2021-06-12 MED ORDER — IOHEXOL 350 MG/ML SOLN
100.0000 mL | Freq: Once | INTRAVENOUS | Status: AC | PRN
  Administered 2021-06-12: 100 mL via INTRAVENOUS

## 2021-06-12 MED ORDER — AMOXICILLIN-POT CLAVULANATE 875-125 MG PO TABS
1.0000 | ORAL_TABLET | Freq: Two times a day (BID) | ORAL | 0 refills | Status: DC
  Filled 2021-06-12: qty 10, 5d supply, fill #0

## 2021-06-12 MED ORDER — IBUPROFEN 600 MG PO TABS: 600.0000 mg | ORAL_TABLET | Freq: Three times a day (TID) | ORAL | 0 refills | Status: AC

## 2021-06-12 NOTE — Discharge Instructions (Addendum)
The CT scan is not revealing any clear concerning intra-abdominal finding.  Your urine test, blood work is also reassuring  The pain is either musculoskeletal or possibly due to inflammatory condition which cannot be picked up on CT scan.  Given that you do not have a PCP, we are giving you follow-up with a GI doctor.  It is prudent that you do get a PCP as soon as possible so that they can help you navigate with the proper diagnosis and management of this condition.

## 2021-06-12 NOTE — ED Triage Notes (Signed)
Several months of back left abd pain that intensified the last few days. Sent from [facility] to r/o Diverticulitis.

## 2021-06-12 NOTE — ED Notes (Signed)
Attempted to call Augmentin script to [facility] as listed on chart. They are closed. Left a message on pts VM to call and speak with writer regarding prescription.

## 2021-06-18 NOTE — ED Provider Notes (Signed)
[facility] EMERGENCY DEPT Provider Note   CSN: [id number] Arrival date & time: 06/12/21  8144     History Chief Complaint  Patient presents with   Abdominal Pain    Adam Kaiser is a 45 y.o. male.  HPI    Pt comes in with cc of abd pain. Pt has no significant medical hx. He reports several days of abd pain. LLQ pain has become more intense in the last 3-4 days, and more constant since y'day. No hx of kidney stones, diverticulitis, abd surgery. Hx of epididymitis - denies any scrotal pain, uti like symptoms. No recent trauma in the perineal region and denies any strain.  History reviewed. No pertinent past medical history.  Patient Active Problem List   Diagnosis Date Noted   DEPRESSIVE DISORDER 06/05/2010   EPIDIDYMITIS, LEFT 08/25/2007   FATIGUE, CHRONIC 08/25/2007   DYSURIA 08/25/2007    History reviewed. No pertinent surgical history.     No family history on file.  Social History   Tobacco Use   Smoking status: Never   Smokeless tobacco: Never  Vaping Use   Vaping Use: Never used  Substance Use Topics   Alcohol use: Never   Drug use: Never    Home Medications Prior to Admission medications   Medication Sig Start Date End Date Taking? Authorizing Provider  amoxicillin-clavulanate (AUGMENTIN) 875-125 MG tablet Take 1 tablet by mouth every 12 (twelve) hours. 06/12/21  Yes Derwood Kaplan, MD    Allergies    Patient has no known allergies.  Review of Systems   Review of Systems  Constitutional:  Positive for activity change.  Respiratory:  Negative for shortness of breath.   Cardiovascular:  Negative for chest pain.  Gastrointestinal:  Positive for abdominal pain, nausea and vomiting.  Genitourinary:  Negative for dysuria and hematuria.  Allergic/Immunologic: Negative for immunocompromised state.  All other systems reviewed and are negative.  Physical Exam Updated Vital Signs BP 111/85 (BP Location: Right Arm)   Pulse 76   Temp  98.1 F (36.7 C) (Oral)   Resp 16   Ht 6' (1.829 m)   Wt 104.3 kg   SpO2 97%   BMI 31.19 kg/m   Physical Exam Vitals and nursing note reviewed.  Constitutional:      Appearance: He is well-developed.  HENT:     Head: Atraumatic.  Cardiovascular:     Rate and Rhythm: Normal rate.  Pulmonary:     Effort: Pulmonary effort is normal.  Abdominal:     Tenderness: There is abdominal tenderness in the left lower quadrant. There is guarding. There is no rebound. Negative signs include Murphy's sign, Rovsing's sign and McBurney's sign.     Hernia: No hernia is present.  Musculoskeletal:     Cervical back: Neck supple.  Skin:    General: Skin is warm.  Neurological:     Mental Status: He is alert and oriented to person, place, and time.    ED Results / Procedures / Treatments   Labs (all labs ordered are listed, but only abnormal results are displayed) Labs Reviewed  COMPREHENSIVE METABOLIC PANEL - Abnormal; Notable for the following components:      Result Value   Glucose, Bld 107 (*)    BUN 26 (*)    Creatinine, Ser 1.28 (*)    AST 14 (*)    All other components within normal limits  LIPASE, BLOOD  CBC  URINALYSIS, ROUTINE W REFLEX MICROSCOPIC    EKG None  Radiology No  results found.  Procedures Procedures   Medications Ordered in ED Medications  iohexol (OMNIPAQUE) 350 MG/ML injection 100 mL (100 mLs Intravenous Contrast Given 06/12/21 1035)    ED Course  I have reviewed the triage vital signs and the nursing notes.  Pertinent labs & imaging results that were available during my care of the patient were reviewed by me and considered in my medical decision making (see chart for details).    MDM Rules/Calculators/A&P                           Pt comes in with cc of abd pain. PT has new lower quadrant abd pain. No n/v/f/c/diarrhea. No uti like symptoms. Exam - + lower abd guarding on the L side, including in the inguinal region.  There is no  hernia.  Will CT  - eval for diverticulitis, contained abscess/perf.    Final Clinical Impression(s) / ED Diagnoses Final diagnoses:  Lower abdominal pain    Rx / DC Orders ED Discharge Orders          Ordered    ibuprofen (ADVIL) 600 MG tablet  3 times daily        06/12/21 1145    amoxicillin-clavulanate (AUGMENTIN) 875-125 MG tablet  Every 12 hours        06/12/21 1145             Derwood Kaplan, MD 06/18/21 1000

## 2022-08-26 IMAGING — CT CT NECK W/ CM
4 series · 15 of 33 positions shown, 18 images · IV contrast (omnipaque)
Comparison: None.

CLINICAL DATA: Difficulty swallowing. Speaking in partial
sentences.

EXAM:
CT NECK WITH CONTRAST
TECHNIQUE: Multidetector CT imaging of the neck was performed using the
standard protocol following the bolus administration of intravenous
contrast.
CONTRAST:  75mL OMNIPAQUE IOHEXOL 300 MG/ML  SOLN

[Series 2: axial neck · axial · 0.54mm/px · z∈[+1355,+1531]mm · 5 of 134 slices shown, 7 images]
[im 23/134  soft-tissue]
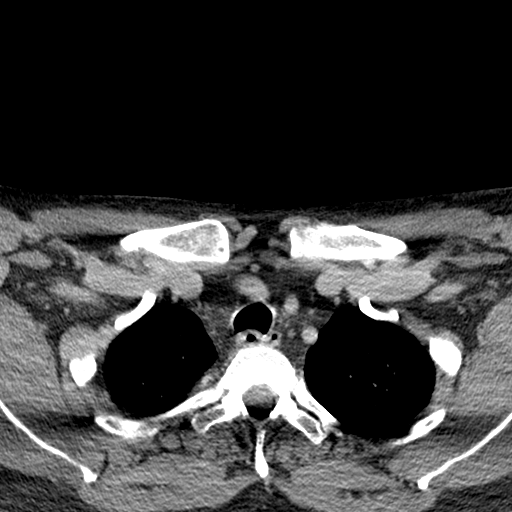
[im 23/134  bone]
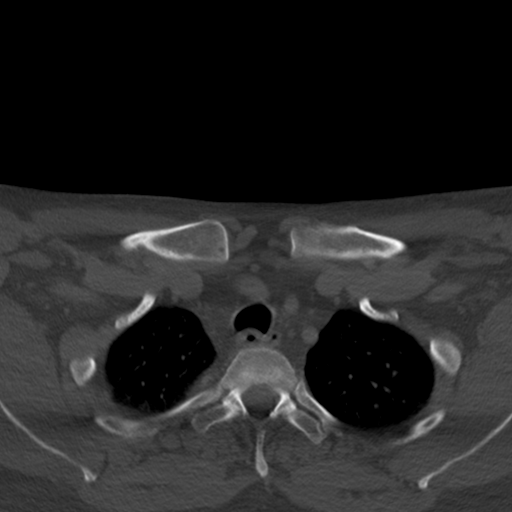
[im 45/134  bone]
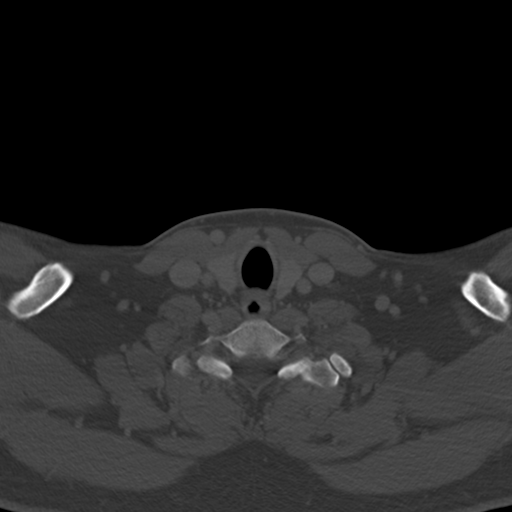
[im 67/134  bone]
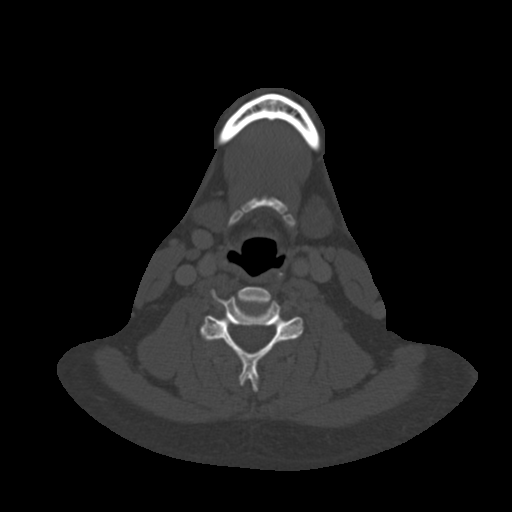
[im 89/134  bone]
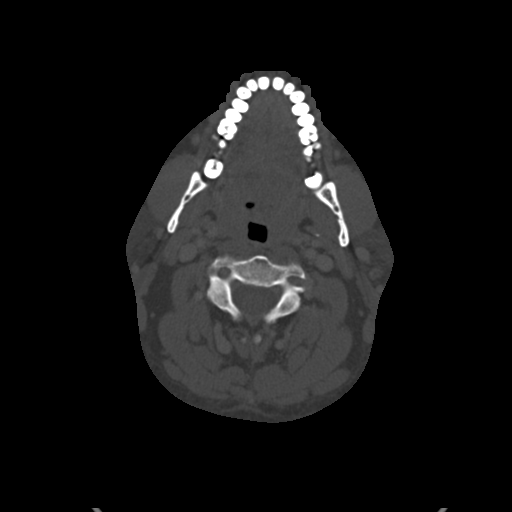
[im 111/134  soft-tissue]
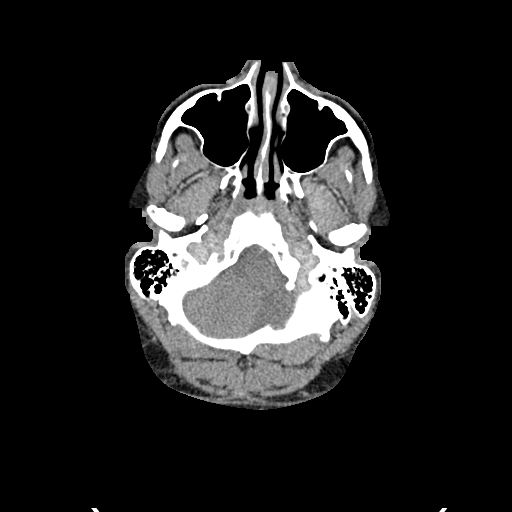
[im 111/134  bone]
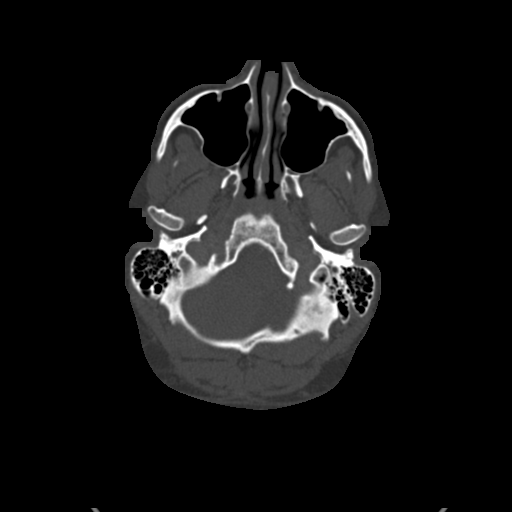

[Series 5: axial · axial · 0.39mm/px · z∈[+1353,+1397]mm · 2 of 135 slices shown]
[im 23/135  bone]
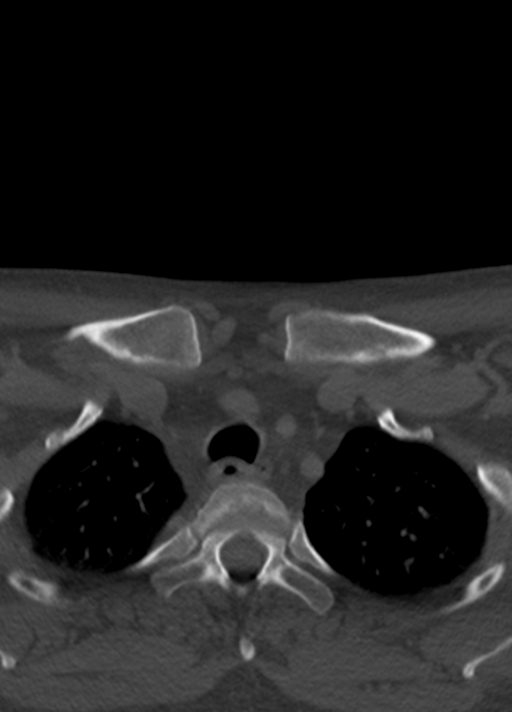
[im 45/135  bone]
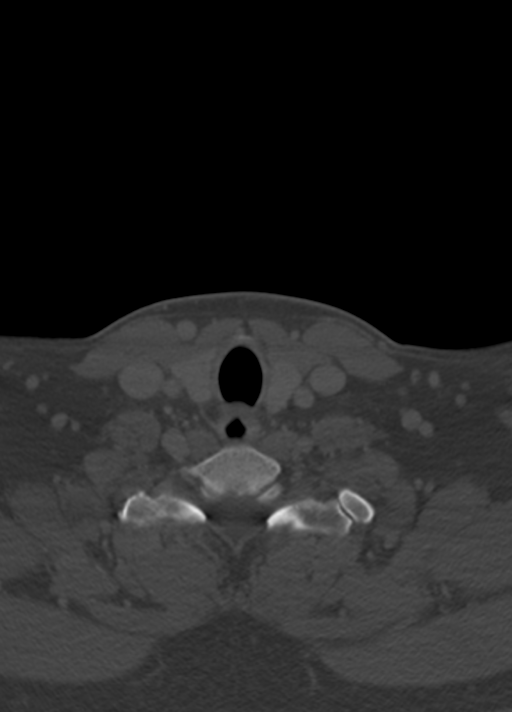

[Series 6: coronal · coronal · 0.53mm/px · 3 of 141 slices shown]
[im 29/141  bone]
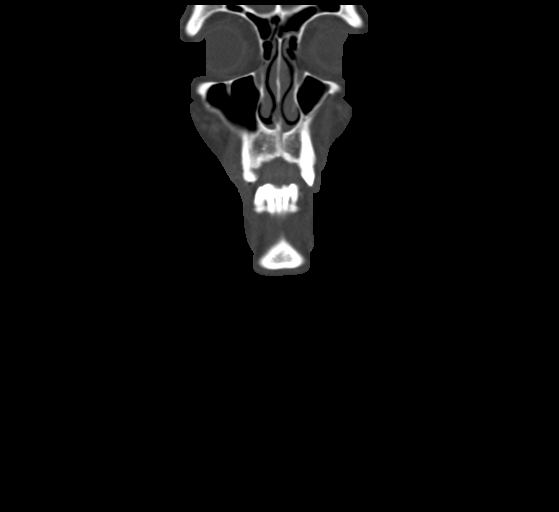
[im 57/141  bone]
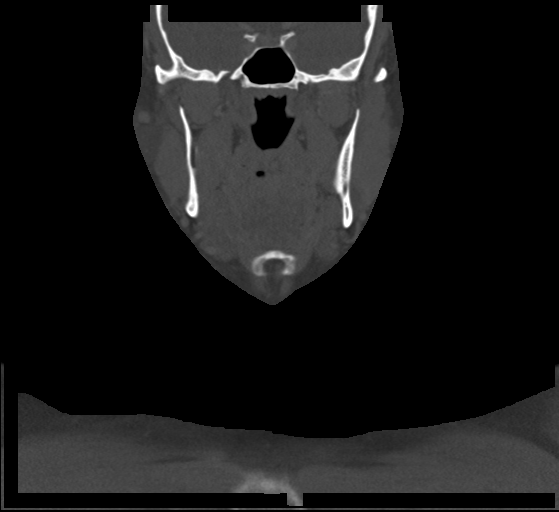
[im 85/141  bone]
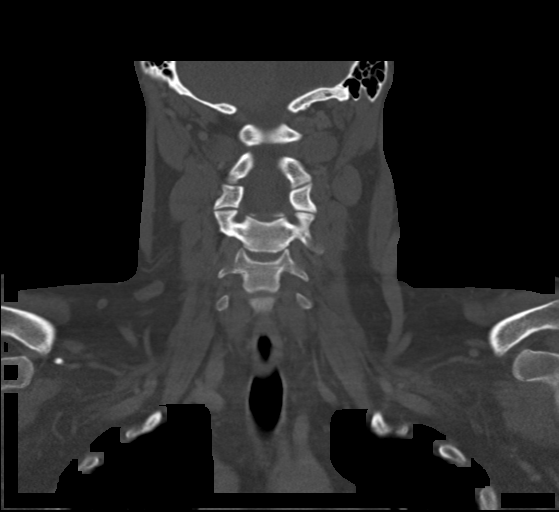

[Series 7: sagittal · sagittal · 0.55mm/px · 5 of 101 slices shown, 6 images]
[im 34/101  bone]
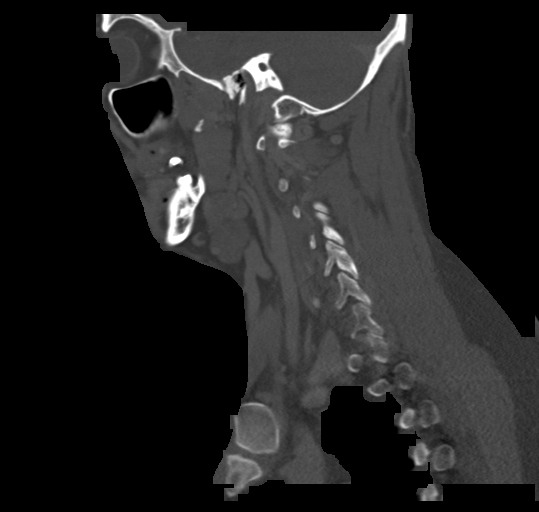
[im 42/101  bone]
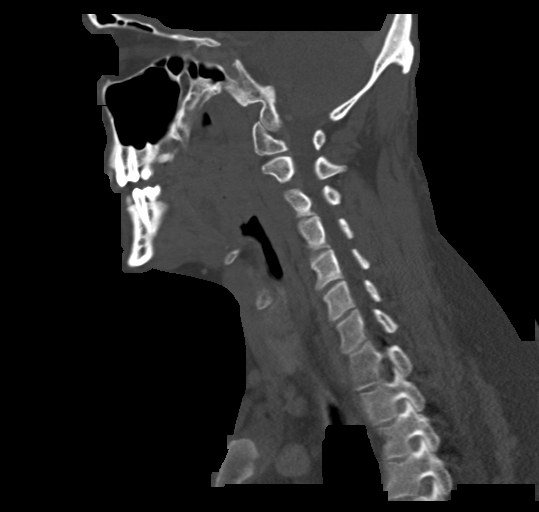
[im 51/101  soft-tissue]
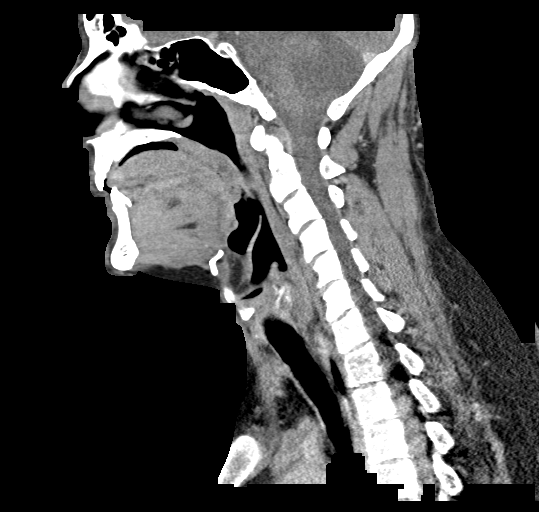
[im 51/101  bone]
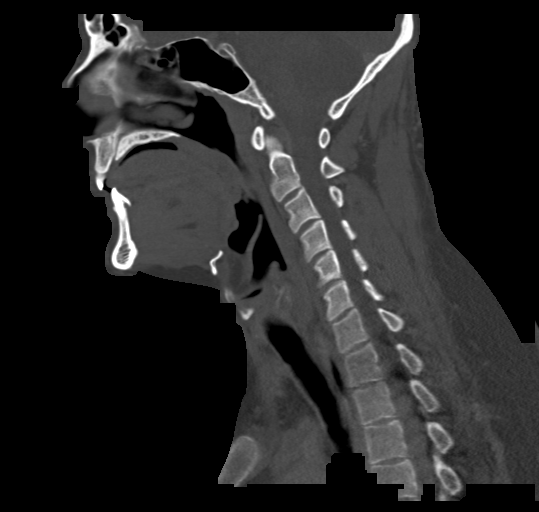
[im 59/101  bone]
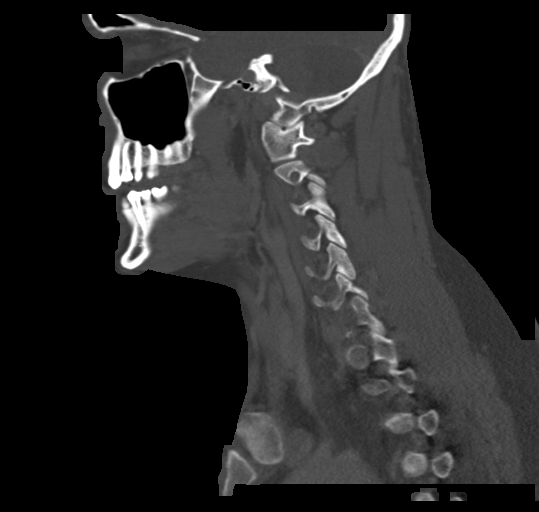
[im 67/101  bone]
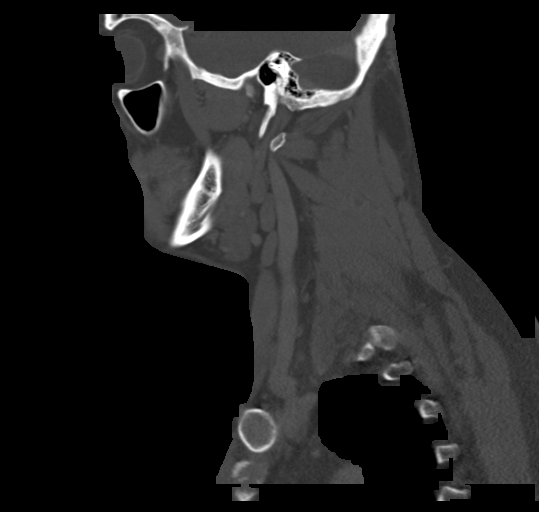

[15 of 33 positions shown; findings below may reference images not displayed]

FINDINGS: Pharynx and larynx: No evidence of mass or swelling.
Mild-to-moderate symmetric prominence of the palatine tonsils.
Widely patent airway. No fluid collection or significant
inflammatory changes in the parapharyngeal or retropharyngeal
spaces.

Salivary glands: No inflammation, mass, or stone.

Thyroid: Unremarkable.

Lymph nodes: Mildly prominent subcentimeter short axis lymph nodes
scattered throughout the neck bilaterally, nonspecific though
presumably reactive.

Vascular: Unremarkable.

Limited intracranial: Large area of geographic low density involving
the left cerebellum which may reflect a chronic infarct versus an
arachnoid cyst with hypoplastic left cerebellar hemisphere.

Visualized orbits: Unremarkable.

Mastoids and visualized paranasal sinuses: Clear.

Skeleton: No acute osseous abnormality or suspicious osseous lesion.

Upper chest: Clear lung apices.

Other: None.
IMPRESSION: 1. No acute abnormality identified in the neck.
2. Large chronic left cerebellar infarct versus posterior fossa
arachnoid cyst.

## 2023-01-05 IMAGING — CT CT ABD-PELV W/ CM
2 of 5 series · 16 of 46 positions shown, 18 images · IV contrast (APPLIED)
Comparison: None.

CLINICAL DATA: 44-year-old male with left lower quadrant pain,
concern for diverticulitis.

EXAM:
CT ABDOMEN AND PELVIS WITH CONTRAST
TECHNIQUE: Multidetector CT imaging of the abdomen and pelvis was performed
using the standard protocol following bolus administration of
intravenous contrast.
CONTRAST:  100mL OMNIPAQUE IOHEXOL 350 MG/ML SOLN

[Series 2: abd pel w · axial · 0.88mm/px · z∈[+773,+1208]mm · 13 of 99 slices shown, 15 images]
[im 6/99  soft-tissue]
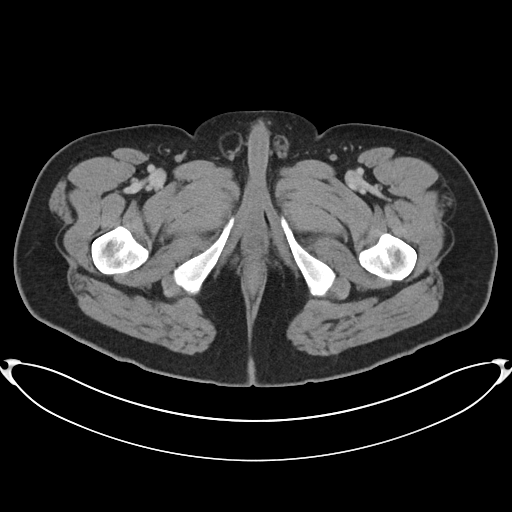
[im 6/99  bone]
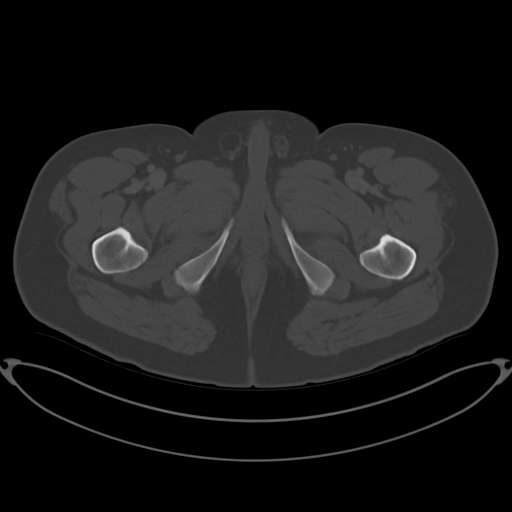
[im 16/99  soft-tissue]
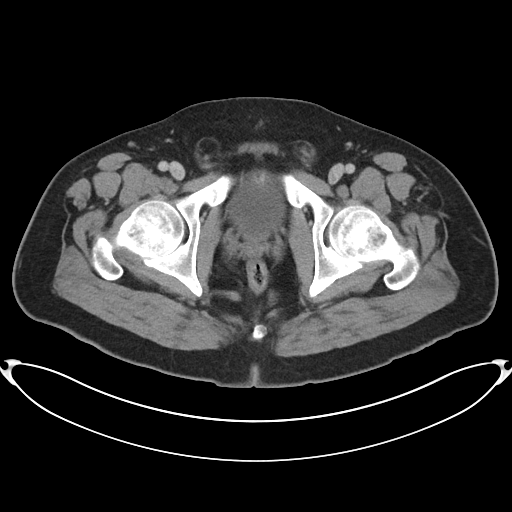
[im 21/99  soft-tissue]
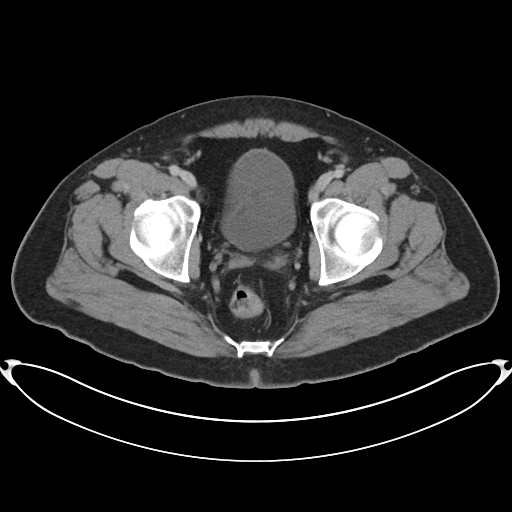
[im 26/99  soft-tissue]
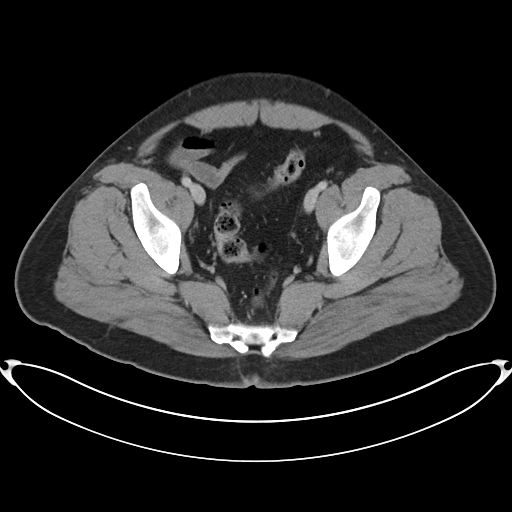
[im 37/99  soft-tissue]
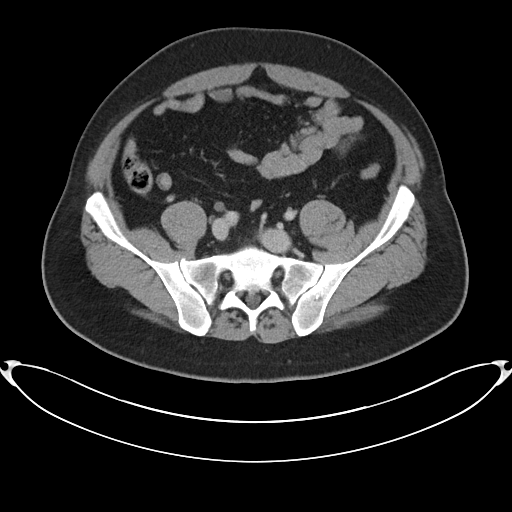
[im 42/99  soft-tissue]
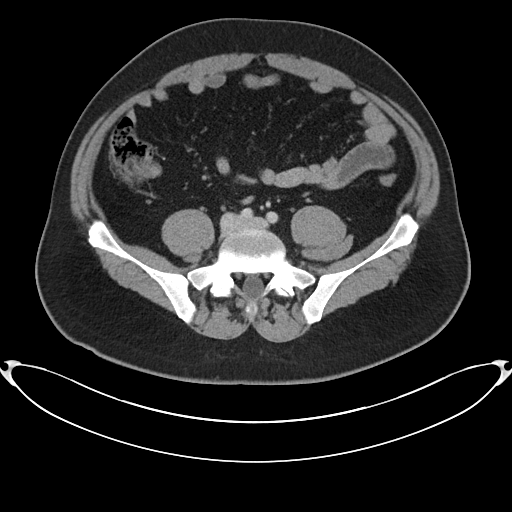
[im 52/99  soft-tissue]
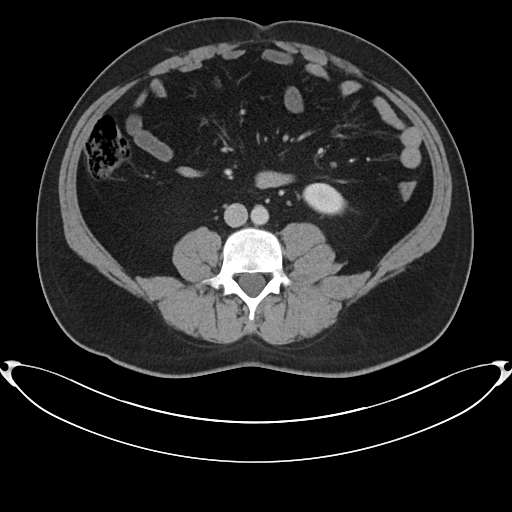
[im 57/99  soft-tissue]
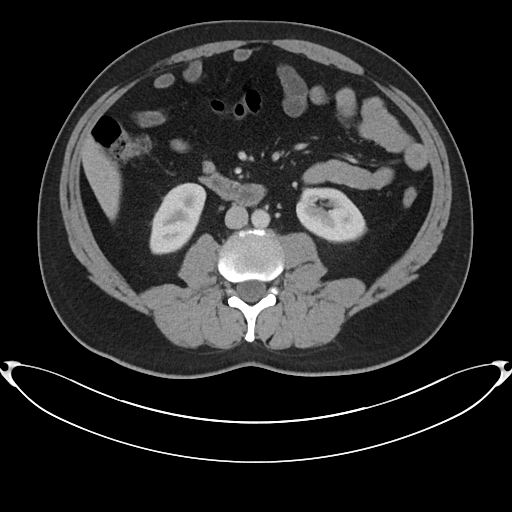
[im 62/99  soft-tissue]
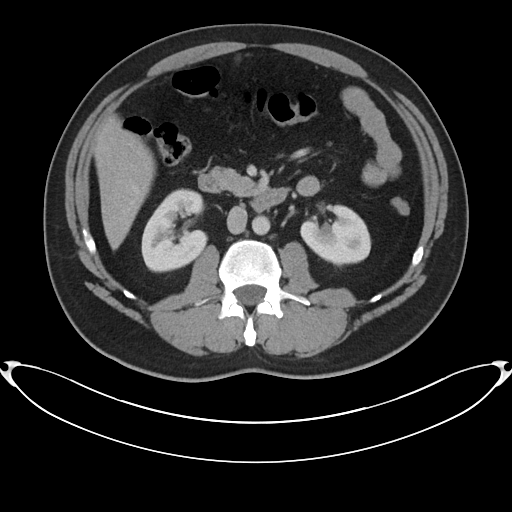
[im 62/99  bone]
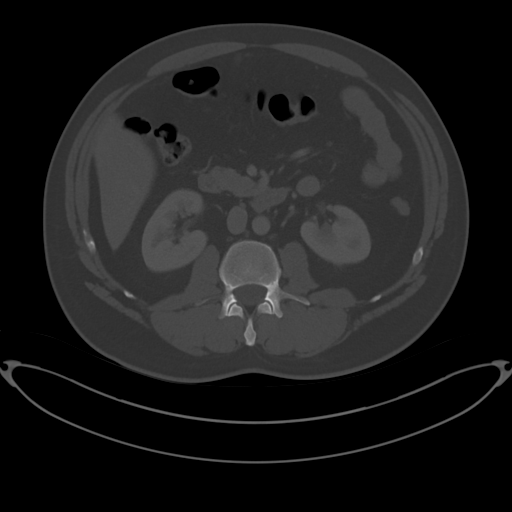
[im 73/99  soft-tissue]
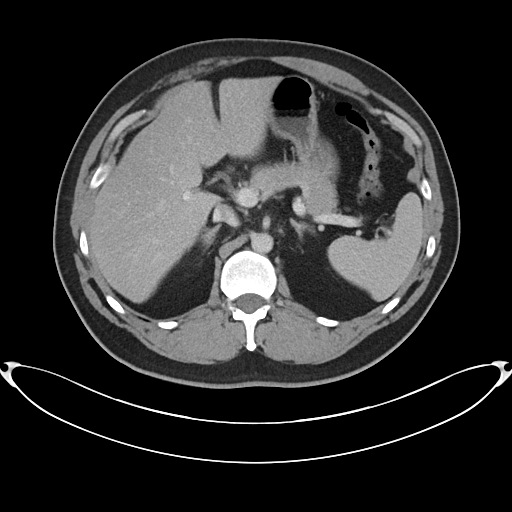
[im 78/99  soft-tissue]
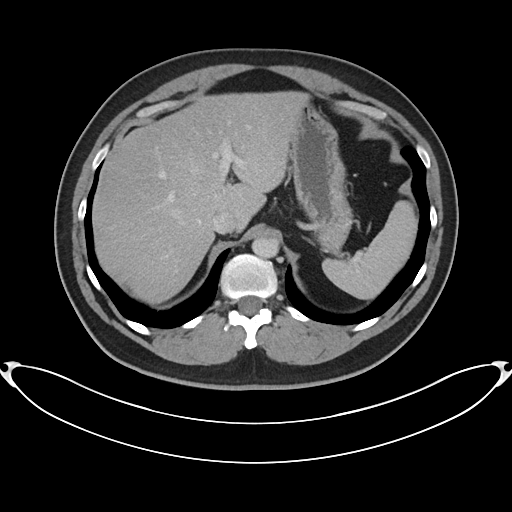
[im 83/99  soft-tissue]
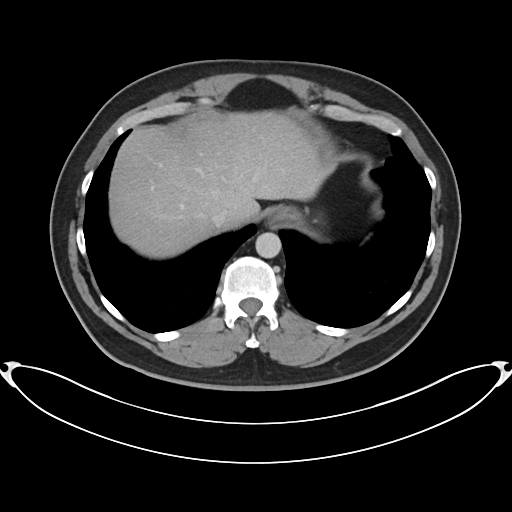
[im 93/99  soft-tissue]
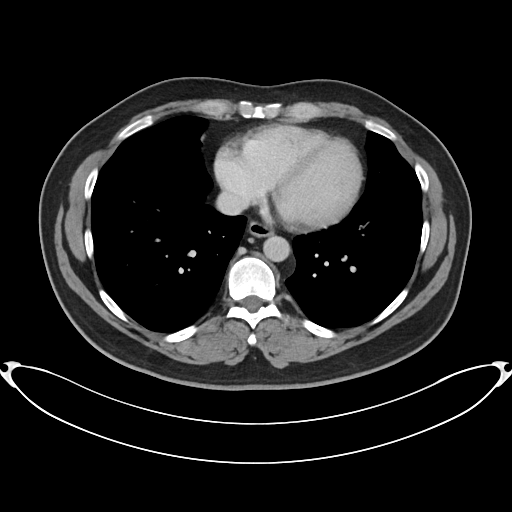

[Series 5: coronal · coronal · 0.84mm/px · 3 of 119 slices shown]
[im 40/119  soft-tissue]
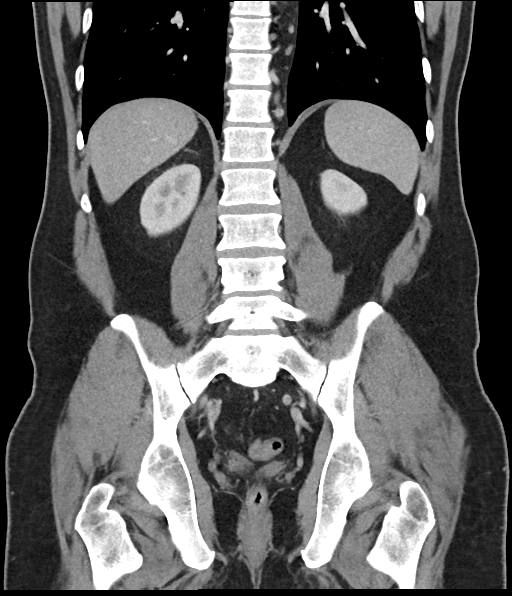
[im 53/119  soft-tissue]
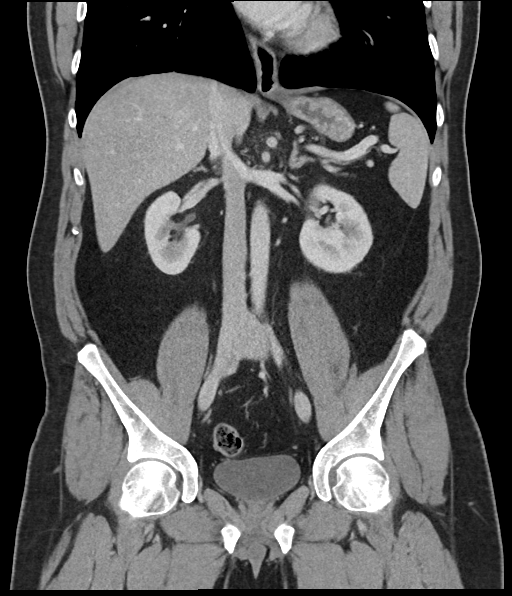
[im 66/119  soft-tissue]
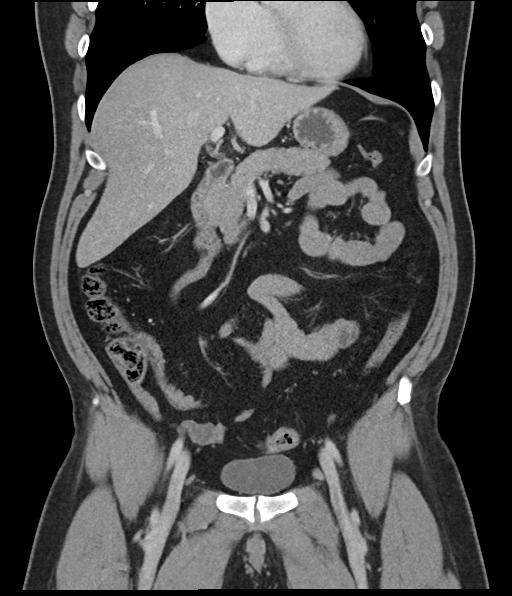

[16 of 46 positions shown; findings below may reference images not displayed]

FINDINGS: Lower chest: No acute abnormality.

Hepatobiliary: No focal liver abnormality is seen. No gallstones,
gallbladder wall thickening, or biliary dilatation.

Pancreas: Unremarkable. No pancreatic ductal dilatation or
surrounding inflammatory changes.

Spleen: Normal in size without focal abnormality.

Adrenals/Urinary Tract: Adrenal glands are unremarkable. Kidneys are
normal, without renal calculi, focal lesion, or hydronephrosis.
Bladder is unremarkable.

Stomach/Bowel: Stomach is within normal limits. Appendix appears
normal. No evidence of diverticulosis. No evidence of bowel wall
thickening, distention, or inflammatory changes.

Vascular/Lymphatic: No significant vascular findings are present. No
enlarged abdominal or pelvic lymph nodes.

Reproductive: Prostate is unremarkable.

Other: No abdominal wall hernia or abnormality. No abdominopelvic
ascites.

Musculoskeletal: No acute or significant osseous findings.
IMPRESSION: No acute abdominopelvic abnormality to explain left lower quadrant
pain.

## 2023-05-11 ENCOUNTER — Ambulatory Visit: Payer: BC Managed Care – PPO | Admitting: Nurse Practitioner

## 2023-05-16 ENCOUNTER — Ambulatory Visit: Payer: BC Managed Care – PPO | Admitting: Nurse Practitioner

## 2023-05-16 ENCOUNTER — Encounter: Payer: Self-pay | Admitting: Nurse Practitioner

## 2023-05-16 VITALS — BP 122/78 | HR 75 | Temp 97.8°F | Ht 72.0 in | Wt 231.0 lb

## 2023-05-16 DIAGNOSIS — Z1211 Encounter for screening for malignant neoplasm of colon: Secondary | ICD-10-CM

## 2023-05-16 DIAGNOSIS — G939 Disorder of brain, unspecified: Secondary | ICD-10-CM

## 2023-05-16 DIAGNOSIS — R5383 Other fatigue: Secondary | ICD-10-CM | POA: Diagnosis not present

## 2023-05-16 DIAGNOSIS — Z7689 Persons encountering health services in other specified circumstances: Secondary | ICD-10-CM | POA: Insufficient documentation

## 2023-05-16 DIAGNOSIS — Z0001 Encounter for general adult medical examination with abnormal findings: Secondary | ICD-10-CM | POA: Insufficient documentation

## 2023-05-16 DIAGNOSIS — R599 Enlarged lymph nodes, unspecified: Secondary | ICD-10-CM

## 2023-05-16 DIAGNOSIS — J392 Other diseases of pharynx: Secondary | ICD-10-CM | POA: Diagnosis not present

## 2023-05-16 NOTE — Patient Instructions (Signed)
Nice to see you today I will be in touch with the lab once I have it Reach out to me when we can get the rest of the blood and the MRI  Follow up 6-8 months for you physical

## 2023-05-16 NOTE — Assessment & Plan Note (Signed)
Incidental finding on a CT scan of the neck that showed cerebellar infarct versus arachnoid cyst patient does have headaches.  Will do MRI once patient has insurance exam benign in office today

## 2023-05-16 NOTE — Assessment & Plan Note (Signed)
Been cyclic for years.  Most recently happened last week.  None present today we will check HIV test today.  Also consider doing Epstein-Barr virus workup in the future when patient has insurance along with diabetes check

## 2023-05-16 NOTE — Assessment & Plan Note (Signed)
Likely reactive in nature as this is cyclic.  Plan to check TSH along with a CBC

## 2023-05-16 NOTE — Progress Notes (Signed)
New Patient Office Visit  Subjective    Patient ID: Adam Kaiser, male    DOB: 11-18-1975  Age: 47 y.o. MRN: [id number]  CC:  Chief Complaint  Patient presents with   Adenopathy    Pt states of swollen lymph nodes and lesions on back of throat. Comes and goes. Pt states of sore throat when lesions are active.     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 With some blurry vision in the left eye   Sore throat: states that he will get this every couple of months. States that he will have a lesion that will last approx 3 days. States that he can swallow when it occurs but it does hurt he will use a natural mouthwash that he feels like helps.  States he has had mono as a child per his report.  States this is been going on for years.   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 States that he will get heads in the front and back part of the head. Worse when he gets hot. States described as a brain freeze and pressure. States that it can last for 5 minutes to 5 hours and he cannot get it ot go away. States that he does not have accompanying symptoms such as nausea, vomiting, visual changes, light or sound sensitivity for complete physical and follow up of chronic conditions.  Immunizations: -Tetanus: over due, patient defers today -Influenza: Out of season -Shingles: Too young -Pneumonia: Too young   Colonoscopy: Has never done ambulatory referral to GI in [facility] Lung Cancer Screening: N/A  PSA: Too young, currently average risk      Outpatient Encounter Medications as of 05/16/2023  Medication Sig   amoxicillin-clavulanate (AUGMENTIN) 875-125 MG tablet Take 1 tablet by mouth every 12 (twelve) hours.   No facility-administered encounter medications on file as of 05/16/2023.    Past Medical History:  Diagnosis Date   Herpetic lesion     History  reviewed. No pertinent surgical history.  Family History  Problem Relation Age of Onset   COPD Father    Heart disease Brother     Social History   Socioeconomic History   Marital status: Married    Spouse name: Marylu Lund   Number of children: 3   Years of education: Not on file   Highest education level: Not on file  Occupational History   Not on file  Tobacco Use   Smoking status: Never   Smokeless tobacco: Never  Vaping Use   Vaping status: Never Used  Substance and Sexual Activity   Alcohol use: Never   Drug use: Never   Sexual activity: Not on file  Other Topics Concern   Not on file  Social History Narrative   Fulltime: massage therapy       Jansen (15)   Kaitlyn (21)   Alyssa (23)      Hobbies: car shows    Social Determinants of Corporate investment banker Strain: Not on file  Food Insecurity: Not on file  Transportation Needs: Not on file  Physical Activity: Not on file  Stress: Not on file  Social Connections: Not on file  Intimate Partner Violence: Not on file    Review of Systems  Constitutional:  Negative for chills and fever.  HENT:  Positive for sore throat (intermittent).        "+"  lymphadenoapthy  Respiratory:  Negative for shortness of breath.   Cardiovascular:  Negative for chest pain.  Neurological:  Positive for headaches. Negative for weakness.        Objective    BP 122/78   Pulse 75   Temp 97.8 F (36.6 C) (Temporal)   Ht 6' (1.829 m)   Wt 231 lb (104.8 kg)   SpO2 95%   BMI 31.33 kg/m   Physical Exam Vitals and nursing note reviewed.  Constitutional:      Appearance: Normal appearance.  HENT:     Right Ear: Tympanic membrane, ear canal and external ear normal.     Left Ear: Tympanic membrane, ear canal and external ear normal.     Mouth/Throat:     Mouth: Mucous membranes are moist.     Pharynx: Oropharynx is clear. No posterior oropharyngeal erythema.  Eyes:     Extraocular Movements: Extraocular movements  intact.     Pupils: Pupils are equal, round, and reactive to light.  Neck:   Cardiovascular:     Rate and Rhythm: Normal rate and regular rhythm.     Pulses: Normal pulses.     Heart sounds: Normal heart sounds.  Pulmonary:     Effort: Pulmonary effort is normal.     Breath sounds: Normal breath sounds.  Musculoskeletal:     Cervical back: No tenderness.     Right lower leg: No edema.     Left lower leg: No edema.  Lymphadenopathy:     Cervical: Cervical adenopathy present.  Skin:    General: Skin is warm.  Neurological:     General: No focal deficit present.     Mental Status: He is alert.     Deep Tendon Reflexes:     Reflex Scores:      Bicep reflexes are 2+ on the right side and 2+ on the left side.      Patellar reflexes are 2+ on the right side and 2+ on the left side.    Comments: Bilateral upper and lower extremity strength 5/5  Psychiatric:        Mood and Affect: Mood normal.        Behavior: Behavior normal.        Thought Content: Thought content normal.        Judgment: Judgment normal.         Assessment & Plan:   Problem List Items Addressed This Visit       Respiratory   Lesion of throat - Primary    Been cyclic for years.  Most recently happened last week.  None present today we will check HIV test today.  Also consider doing Epstein-Barr virus workup in the future when patient has insurance along with diabetes check      Relevant Orders   HIV Antibody (routine testing w rflx)     Nervous and Auditory   Lesion of brain    Incidental finding on a CT scan of the neck that showed cerebellar infarct versus arachnoid cyst patient does have headaches.  Will do MRI once patient has insurance exam benign in office today        Immune and Lymphatic   Adenopathy    Likely reactive in nature as this is cyclic.  Plan to check TSH along with a CBC      Relevant Orders   HIV Antibody (routine testing w rflx)     Other   Fatigue    Longstanding  issue  with patient.  Consider checking TSH, B12, vitamin D along with basic blood work.  Also hammered down on possible sleep apnea versus depression      Relevant Orders   HIV Antibody (routine testing w rflx)   Establishing care with new doctor, encounter for    Did review blood work from 2 years ago along with most recent CT scan of abdomen pelvis and CT scan of soft tissue of neck      Other Visit Diagnoses     Screening for colon cancer       Relevant Orders   Ambulatory referral to Gastroenterology       Return in about 6 months (around 11/16/2023) for CPE and Labs.   Audria Nine, NP

## 2023-05-16 NOTE — Assessment & Plan Note (Signed)
Longstanding issue with patient.  Consider checking TSH, B12, vitamin D along with basic blood work.  Also hammered down on possible sleep apnea versus depression

## 2023-05-16 NOTE — Assessment & Plan Note (Signed)
Did review blood work from 2 years ago along with most recent CT scan of abdomen pelvis and CT scan of soft tissue of neck

## 2024-04-11 DIAGNOSIS — H02889 Meibomian gland dysfunction of unspecified eye, unspecified eyelid: Secondary | ICD-10-CM | POA: Diagnosis not present

## 2024-04-11 DIAGNOSIS — H1132 Conjunctival hemorrhage, left eye: Secondary | ICD-10-CM | POA: Diagnosis not present

## 2024-04-11 DIAGNOSIS — B0052 Herpesviral keratitis: Secondary | ICD-10-CM | POA: Diagnosis not present

## 2024-05-23 ENCOUNTER — Encounter: Payer: Self-pay | Admitting: Nurse Practitioner

## 2024-05-23 ENCOUNTER — Telehealth: Payer: Self-pay

## 2024-05-23 ENCOUNTER — Other Ambulatory Visit: Payer: Self-pay | Admitting: Nurse Practitioner

## 2024-05-23 ENCOUNTER — Ambulatory Visit
Admission: RE | Admit: 2024-05-23 | Discharge: 2024-05-23 | Disposition: A | Discharge status: Home / Self Care | Source: Ambulatory Visit | Attending: Nurse Practitioner | Admitting: Nurse Practitioner

## 2024-05-23 ENCOUNTER — Other Ambulatory Visit (HOSPITAL_COMMUNITY): Payer: Self-pay

## 2024-05-23 ENCOUNTER — Ambulatory Visit (INDEPENDENT_AMBULATORY_CARE_PROVIDER_SITE_OTHER): Payer: Self-pay | Admitting: Nurse Practitioner

## 2024-05-23 VITALS — BP 96/82 | HR 62 | Temp 97.8°F | Ht 71.75 in | Wt 230.6 lb

## 2024-05-23 DIAGNOSIS — Z1159 Encounter for screening for other viral diseases: Secondary | ICD-10-CM

## 2024-05-23 DIAGNOSIS — Z131 Encounter for screening for diabetes mellitus: Secondary | ICD-10-CM

## 2024-05-23 DIAGNOSIS — G939 Disorder of brain, unspecified: Secondary | ICD-10-CM | POA: Diagnosis not present

## 2024-05-23 DIAGNOSIS — R5383 Other fatigue: Secondary | ICD-10-CM

## 2024-05-23 DIAGNOSIS — Z1211 Encounter for screening for malignant neoplasm of colon: Secondary | ICD-10-CM

## 2024-05-23 DIAGNOSIS — R350 Frequency of micturition: Secondary | ICD-10-CM | POA: Insufficient documentation

## 2024-05-23 DIAGNOSIS — R531 Weakness: Secondary | ICD-10-CM | POA: Diagnosis not present

## 2024-05-23 DIAGNOSIS — Z13220 Encounter for screening for lipoid disorders: Secondary | ICD-10-CM | POA: Diagnosis not present

## 2024-05-23 DIAGNOSIS — R21 Rash and other nonspecific skin eruption: Secondary | ICD-10-CM | POA: Diagnosis not present

## 2024-05-23 DIAGNOSIS — G478 Other sleep disorders: Secondary | ICD-10-CM | POA: Diagnosis not present

## 2024-05-23 DIAGNOSIS — Z0001 Encounter for general adult medical examination with abnormal findings: Secondary | ICD-10-CM

## 2024-05-23 DIAGNOSIS — M2550 Pain in unspecified joint: Secondary | ICD-10-CM | POA: Insufficient documentation

## 2024-05-23 DIAGNOSIS — Q043 Other reduction deformities of brain: Secondary | ICD-10-CM | POA: Diagnosis not present

## 2024-05-23 LAB — TSH: TSH: 2.1 u[IU]/mL (ref 0.35–5.50)

## 2024-05-23 LAB — CBC WITH DIFFERENTIAL/PLATELET
Basophils Absolute: 0.1 K/uL (ref 0.0–0.1)
Basophils Relative: 0.7 % (ref 0.0–3.0)
Eosinophils Absolute: 0.2 K/uL (ref 0.0–0.7)
Eosinophils Relative: 1.9 % (ref 0.0–5.0)
HCT: 46.9 % (ref 39.0–52.0)
Hemoglobin: 15.6 g/dL (ref 13.0–17.0)
Lymphocytes Relative: 33.6 % (ref 12.0–46.0)
Lymphs Abs: 2.9 K/uL (ref 0.7–4.0)
MCHC: 33.3 g/dL (ref 30.0–36.0)
MCV: 90 fl (ref 78.0–100.0)
Monocytes Absolute: 0.6 K/uL (ref 0.1–1.0)
Monocytes Relative: 6.7 % (ref 3.0–12.0)
Neutro Abs: 4.9 K/uL (ref 1.4–7.7)
Neutrophils Relative %: 57.1 % (ref 43.0–77.0)
Platelets: 233 K/uL (ref 150.0–400.0)
RBC: 5.2 Mil/uL (ref 4.22–5.81)
RDW: 14.2 % (ref 11.5–15.5)
WBC: 8.7 K/uL (ref 4.0–10.5)

## 2024-05-23 LAB — COMPREHENSIVE METABOLIC PANEL WITH GFR
ALT: 35 U/L (ref 0–53)
AST: 17 U/L (ref 0–37)
Albumin: 4.5 g/dL (ref 3.5–5.2)
Alkaline Phosphatase: 43 U/L (ref 39–117)
BUN: 17 mg/dL (ref 6–23)
CO2: 29 meq/L (ref 19–32)
Calcium: 9 mg/dL (ref 8.4–10.5)
Chloride: 104 meq/L (ref 96–112)
Creatinine, Ser: 1.11 mg/dL (ref 0.40–1.50)
GFR: 78.38 mL/min (ref >60.00)
Glucose, Bld: 96 mg/dL (ref 70–99)
Potassium: 4.4 meq/L (ref 3.5–5.1)
Sodium: 139 meq/L (ref 135–145)
Total Bilirubin: 0.7 mg/dL (ref 0.2–1.2)
Total Protein: 7 g/dL (ref 6.0–8.3)

## 2024-05-23 LAB — VITAMIN D 25 HYDROXY (VIT D DEFICIENCY, FRACTURES): VITD: 26.21 ng/mL — ABNORMAL LOW (ref 30.00–100.00)

## 2024-05-23 LAB — HEMOGLOBIN A1C: Hgb A1c MFr Bld: 5.9 % (ref 4.6–6.5)

## 2024-05-23 LAB — LIPID PANEL
Cholesterol: 216 mg/dL — ABNORMAL HIGH (ref 0–200)
HDL: 38 mg/dL — ABNORMAL LOW (ref >39.00)
LDL Cholesterol: 144 mg/dL — ABNORMAL HIGH (ref 0–99)
NonHDL: 178.34
Total CHOL/HDL Ratio: 6
Triglycerides: 170 mg/dL — ABNORMAL HIGH (ref 0.0–149.0)
VLDL: 34 mg/dL (ref 0.0–40.0)

## 2024-05-23 LAB — VITAMIN B12: Vitamin B-12: 365 pg/mL (ref 211–911)

## 2024-05-23 MED ORDER — GADOBUTROL 1 MMOL/ML IV SOLN
10.0000 mL | Freq: Once | INTRAVENOUS | Status: AC | PRN
  Administered 2024-05-23 (×2): 10 mL via INTRAVENOUS

## 2024-05-23 MED ORDER — HYDROCORTISONE 2.5 % EX CREA
1.0000 | TOPICAL_CREAM | Freq: Two times a day (BID) | CUTANEOUS | 0 refills | Status: DC
  Filled 2024-05-23 – 2024-06-01 (×3): qty 30, 10d supply, fill #0

## 2024-05-23 NOTE — Assessment & Plan Note (Signed)
 Multifactorial question possible sleep apnea will get metabolic causes pending CBC, CMP, B12, D, TSH.

## 2024-05-23 NOTE — Telephone Encounter (Signed)
 [facility] called stating the pt's order for mri, needs to say with contrast & without contrast. Spoke to matt, he stated if the tech would pend the order, he'd sign it. Megan, the tech, asked if the authorization needed to be changed due to ins purposes or not? Tech said the referral coordinator would know if it does or not. Please advise. Duwaine provided her direct call back # [phone]. Megan requested a call once authorization has been given for order.

## 2024-05-23 NOTE — Assessment & Plan Note (Signed)
 Pending UA.

## 2024-05-23 NOTE — Assessment & Plan Note (Signed)
 Discussed age-appropriate immunizations and screening exams.  Did review patient's personal, surgical, social, family histories.  Patient up-to-date on all age-appropriate vaccinations he would like.  Patient declined tetanus vaccine today.  Cologuard ordered for CRC screening.  Patient is too young for prostate cancer screening.  Patient was given information at discharge about preventative healthcare maintenance with anticipatory guidance.

## 2024-05-23 NOTE — Assessment & Plan Note (Signed)
 Ambiguous in nature with other ongoing symptoms check ANA, sed rate.

## 2024-05-23 NOTE — Patient Instructions (Signed)
 Nice to see you toyda I will be in touch with the labs once I have them  Follow up with me in 1 year, sooner if you need me  They will reach out to you about the MRI  Try an Cerve or Eucerin on the face for a week. If no improvement use the cream twice a day no more than 7 days in a row

## 2024-05-23 NOTE — Progress Notes (Signed)
 Established Patient Office Visit  Subjective   Patient ID: Adam Kaiser, male    DOB: October 31, 1975  Age: 48 y.o. MRN: [id number]  Chief Complaint  Patient presents with   Annual Exam    Declines vaccines. Pt complains of ongoing fatigue. States his blood pressure continues to lower and feels tired all the time.     HPI   Lesion of the brain: needs MRI brian and patient has been having some gneralized weakness, headaches and gfatigue for over a year. States that over the past 2 months it has gotten worse.   for complete physical and follow up of chronic conditions.  Immunizations: -Tetanus: refused  -Influenza: Out of season -Shingles: Too young -Pneumonia: Too young  Diet: Fair diet. He is doing 3-4 meals and some snacks. He is doing coffee, tea, and soda Exercise: No regular exercise. States that he has no energy to   Eye exam:  yearly  Dental exam: Needs updating    Colonoscopy: Never done ordered cologuard  Lung Cancer Screening: N/A  PSA: Too young  Sleep: state that he goes to bed 930-10 and gets up at 6. Does not snore and does not feel rested. States that he will wake up and other times to pee.   Rash states that it has been approx 3 weeks that is burning and then it will cause a headache. States that he noticed that the sun makes it worse       Review of Systems  Constitutional:  Positive for malaise/fatigue. Negative for chills and fever.  Respiratory:  Negative for shortness of breath.   Cardiovascular:  Negative for chest pain and leg swelling.  Gastrointestinal:  Negative for abdominal pain, blood in stool, constipation, diarrhea, nausea and vomiting.       Bm daily   Genitourinary:  Positive for frequency. Negative for dysuria and hematuria.  Neurological:  Positive for dizziness and headaches (once a week with the ball games. otc will help). Negative for tingling.  Psychiatric/Behavioral:  Negative for hallucinations and suicidal ideas.        Objective:     BP 96/82   Pulse 62   Temp 97.8 F (36.6 C) (Oral)   Ht 5' 11.75 (1.822 m)   Wt 230 lb 9.6 oz (104.6 kg)   SpO2 98%   BMI 31.49 kg/m  BP Readings from Last 3 Encounters:  05/23/24 96/82  05/16/23 122/78  06/12/21 111/85   Wt Readings from Last 3 Encounters:  05/23/24 230 lb 9.6 oz (104.6 kg)  05/16/23 231 lb (104.8 kg)  06/12/21 230 lb (104.3 kg)   SpO2 Readings from Last 3 Encounters:  05/23/24 98%  05/16/23 95%  06/12/21 97%      Physical Exam Vitals and nursing note reviewed.  Constitutional:      Appearance: Normal appearance.  HENT:     Right Ear: Tympanic membrane, ear canal and external ear normal.     Left Ear: Tympanic membrane, ear canal and external ear normal.     Mouth/Throat:     Mouth: Mucous membranes are moist.     Pharynx: Oropharynx is clear.  Eyes:     Extraocular Movements: Extraocular movements intact.     Pupils: Pupils are equal, round, and reactive to light.  Cardiovascular:     Rate and Rhythm: Normal rate and regular rhythm.     Pulses: Normal pulses.     Heart sounds: Normal heart sounds.  Pulmonary:     Effort: Pulmonary effort  is normal.     Breath sounds: Normal breath sounds.  Abdominal:     General: Bowel sounds are normal. There is no distension.     Palpations: There is no mass.     Tenderness: There is no abdominal tenderness.     Hernia: No hernia is present.  Musculoskeletal:     Right lower leg: No edema.     Left lower leg: No edema.  Lymphadenopathy:     Cervical: No cervical adenopathy.  Skin:    General: Skin is warm.         Comments: Dry flaky erythematous skin along bilateral eyebrows nose and bilateral cheeks given facial hair and rash presentation unsure if it crosses the nasolabial fold  Neurological:     Mental Status: He is alert.     Deep Tendon Reflexes:     Reflex Scores:      Bicep reflexes are 2+ on the right side and 2+ on the left side.      Patellar reflexes are 2+ on the  right side and 2+ on the left side.    Comments: Bilateral upper extremity strength 5/5   RLE 4/5 LLE5/5  Psychiatric:        Mood and Affect: Mood normal.        Behavior: Behavior normal.        Thought Content: Thought content normal.        Judgment: Judgment normal.      No results found for any visits on 05/23/24.    The ASCVD Risk score (Arnett DK, et al., 2019) failed to calculate for the following reasons:   Cannot find a previous HDL lab   Cannot find a previous total cholesterol lab    Assessment & Plan:   Problem List Items Addressed This Visit       Nervous and Auditory   Lesion of brain   History of brain noticed on CT scan of face in the past.  Patient having concerning symptoms for a lesion of the brain pending urgent MRI brain with and without contrast rule out growing lesion      Relevant Orders   MR Brain Wo Contrast   Non-restorative sleep   Multifactorial question possible sleep apnea will get metabolic causes pending CBC, CMP, B12, D, TSH.        Musculoskeletal and Integument   Rash   Rash to face.  Patient will use good lotion like CeraVe or Eucerin for 1 week also can use Vaseline at night to restore moisture if this is not beneficial patient can use hydrocortisone  2.5% cream twice daily for 7 days.  Explicitly reviewed possible steroid precautions and side effects.  Pending ANA      Relevant Medications   hydrocortisone  2.5 % cream   Other Relevant Orders   ANA Screen,IFA,Reflex Titer/Pattern,Reflex Mplx 11 Ab Cascade with IdentRA     Other   Weakness   multifactorial mainly bilateral lower legs.  Patient does have some lower right extremity weakness with previous documented cysts/lesions of the brain pending urgent MRI      Relevant Orders   ANA Screen,IFA,Reflex Titer/Pattern,Reflex Mplx 11 Ab Cascade with IdentRA   Encounter for general adult medical examination with abnormal findings - Primary   Discussed age-appropriate  immunizations and screening exams.  Did review patient's personal, surgical, social, family histories.  Patient up-to-date on all age-appropriate vaccinations he would like.  Patient declined tetanus vaccine today.  Cologuard ordered for CRC screening.  Patient is too young for prostate cancer screening.  Patient was given information at discharge about preventative healthcare maintenance with anticipatory guidance.      Relevant Orders   CBC with Differential/Platelet   Comprehensive metabolic panel with GFR   Multiple joint pain   Ambiguous in nature with other ongoing symptoms check ANA, sed rate.      Relevant Orders   ANA Screen,IFA,Reflex Titer/Pattern,Reflex Mplx 11 Ab Cascade with IdentRA   Urinary frequency   Pending UA      Relevant Orders   Urinalysis w microscopic + reflex cultur   Other Visit Diagnoses       Encounter for hepatitis C screening test for low risk patient       Relevant Orders   Hepatitis C antibody     Screening for colon cancer       Relevant Orders   Cologuard     Screening for diabetes mellitus       Relevant Orders   Hemoglobin A1c     Screening for lipid disorders       Relevant Orders   Lipid panel       Return in about 1 year (around 05/23/2025) for CPE and Labs.    Adina Crandall, NP

## 2024-05-23 NOTE — Telephone Encounter (Signed)
 I have already signed the order.

## 2024-05-23 NOTE — Assessment & Plan Note (Signed)
 Rash to face.  Patient will use good lotion like CeraVe or Eucerin for 1 week also can use Vaseline at night to restore moisture if this is not beneficial patient can use hydrocortisone  2.5% cream twice daily for 7 days.  Explicitly reviewed possible steroid precautions and side effects.  Pending ANA

## 2024-05-23 NOTE — Assessment & Plan Note (Signed)
 multifactorial mainly bilateral lower legs.  Patient does have some lower right extremity weakness with previous documented cysts/lesions of the brain pending urgent MRI

## 2024-05-23 NOTE — Telephone Encounter (Signed)
 Called pt to inform him of location for his MRI of brain and advised him that a mychart message will be sent with address and phone number.

## 2024-05-23 NOTE — Assessment & Plan Note (Signed)
 History of brain noticed on CT scan of face in the past.  Patient having concerning symptoms for a lesion of the brain pending urgent MRI brain with and without contrast rule out growing lesion

## 2024-05-25 ENCOUNTER — Ambulatory Visit: Payer: Self-pay | Admitting: Nurse Practitioner

## 2024-05-25 DIAGNOSIS — D329 Benign neoplasm of meninges, unspecified: Secondary | ICD-10-CM

## 2024-05-25 DIAGNOSIS — R7303 Prediabetes: Secondary | ICD-10-CM

## 2024-05-25 DIAGNOSIS — E7800 Pure hypercholesterolemia, unspecified: Secondary | ICD-10-CM

## 2024-05-25 NOTE — Telephone Encounter (Signed)
-----   Message from [facility] sent at 05/25/2024 10:01 AM EDT ----- Yes, surveillance over the years is likely to be recommended for some years.  I'd recommend referral to my colleague Rashid Janjua, who specializes in this area of neurosurgery.  Adam    ----- Message ----- From: Wendee Lynwood HERO, NP Sent: 05/25/2024   7:19 AM EDT To: Adam Daisy, MD  Notified via My Chart   Dr. Daisy,  I need some advice. Not sure if the meningioma needs any further evaluation or do you recommend just surveillance scans? Thanks for taking the time to look  Adam Kaiser ----- Message ----- From: Interface, Rad Results In Sent: 05/23/2024   3:41 PM EDT To: Lynwood HERO Wendee, NP

## 2024-05-27 LAB — URINALYSIS W MICROSCOPIC + REFLEX CULTURE
Bacteria, UA: NONE SEEN /HPF
Bilirubin Urine: NEGATIVE
Glucose, UA: NEGATIVE
Hgb urine dipstick: NEGATIVE
Hyaline Cast: NONE SEEN /LPF
Ketones, ur: NEGATIVE
Leukocyte Esterase: NEGATIVE
Nitrites, Initial: NEGATIVE
Protein, ur: NEGATIVE
RBC / HPF: NONE SEEN /HPF (ref 0–2)
Specific Gravity, Urine: 1.017 (ref 1.001–1.035)
Squamous Epithelial / HPF: NONE SEEN /HPF (ref <=5)
WBC, UA: NONE SEEN /HPF (ref 0–5)
pH: 5.5 (ref 5.0–8.0)

## 2024-05-27 LAB — ANA SCREEN,IFA,REFLEX TITER/PATTERN,REFLEX MPLX 11 AB CASCADE
Anti Nuclear Antibody (ANA): NEGATIVE
Cyclic Citrullin Peptide Ab: <16 U
MUTATED CITRULLINATED VIMENTIN (MCV) AB: <20 U/mL (ref <20)
Rheumatoid fact SerPl-aCnc: <10 [IU]/mL (ref <14)

## 2024-05-27 LAB — HEPATITIS C ANTIBODY: Hepatitis C Ab: NONREACTIVE

## 2024-05-27 LAB — NO CULTURE INDICATED

## 2024-05-28 ENCOUNTER — Other Ambulatory Visit (HOSPITAL_COMMUNITY): Payer: Self-pay

## 2024-06-01 ENCOUNTER — Other Ambulatory Visit (HOSPITAL_COMMUNITY): Payer: Self-pay

## 2024-06-01 ENCOUNTER — Encounter: Payer: Self-pay | Admitting: Neurosurgery

## 2024-06-01 ENCOUNTER — Ambulatory Visit (INDEPENDENT_AMBULATORY_CARE_PROVIDER_SITE_OTHER): Admitting: Neurosurgery

## 2024-06-01 VITALS — BP 118/76 | HR 71 | Ht 72.0 in | Wt 232.0 lb

## 2024-06-01 DIAGNOSIS — R930 Abnormal findings on diagnostic imaging of skull and head, not elsewhere classified: Secondary | ICD-10-CM | POA: Diagnosis not present

## 2024-06-01 DIAGNOSIS — G4452 New daily persistent headache (NDPH): Secondary | ICD-10-CM | POA: Diagnosis not present

## 2024-06-01 NOTE — Progress Notes (Signed)
 Assessment : 48 year old gentleman with an insignificant past medical history who works as a Teacher, adult education.  A few years ago he started having some headaches but these were very infrequent.  Over time, these became more prominent.  In 2022 he had a CT of his neck for neck pain and at that point a cyst in the posterior fossa was noted and he was told not to worry about it.  Now, the headaches are daily and once or twice a week he wakes up with them.  Most commonly they get worse as the day goes along and he has light sensitivity.  When he is a ballgames of his children he often times has such bad headaches that he has to stop what he is doing.  He does not snore at night and is otherwise fairly active.  He was accompanied by his wife who is a physical therapist.  He had an MRI done which prompted the referral  Plan : I am really sorry to hear that he is doing so poorly.  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  He does not have any coordination difficulty with his left arm or leg.  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  I recommended an MRI in 1 year and I think he would benefit from a neurology evaluation for headaches.  He was very comfortable after this and look forward to seeing him back in a year.  If prior to that there is anything I can do for him, he is welcome to call me.   Social History   Socioeconomic History   Marital status: Married    Spouse name: Clarita   Number of children:  3   Years of education: Not on file   Highest education level: Not on file  Occupational History   Not on file  Tobacco Use   Smoking status: Never   Smokeless tobacco: Never  Vaping Use   Vaping status: Never Used  Substance and Sexual Activity   Alcohol use: Not Currently   Drug use: Never   Sexual activity: Not Currently    Birth control/protection: None  Other Topics Concern   Not on file  Social History Narrative   Fulltime: massage therapy       Jansen (15)   Kaitlyn (21)   Alyssa (23)      Hobbies: car shows    Social Drivers of Corporate investment banker Strain: Not on file  Food Insecurity: Not on file  Transportation Needs: Not on file  Physical Activity: Not on file  Stress: Not on file  Social Connections: Not on file  Intimate Partner Violence: Not on file    Family History  Problem Relation Age of Onset   Neurologic Disorder Mother    COPD Father    Heart disease Brother     No Known Allergies  Past Medical History:  Diagnosis Date   Heart murmur  Herpetic lesion     History reviewed. No pertinent surgical history.   Physical Exam HENT:     Head: Normocephalic.     Nose: Nose normal.  Eyes:     Pupils: Pupils are equal, round, and reactive to light.  Cardiovascular:     Rate and Rhythm: Normal rate.  Pulmonary:     Effort: Pulmonary effort is normal.  Abdominal:     General: Abdomen is flat.  Musculoskeletal:     Cervical back: Normal range of motion.  Neurological:     Mental Status: He is alert.     Cranial Nerves: Cranial nerves 2-12 are intact.     Sensory: Sensation is intact.     Motor: Motor function is intact.     Coordination: Coordination is intact.        Results for orders placed or performed during the hospital encounter of 05/23/24  MR BRAIN W WO CONTRAST   Narrative   EXAM: MRI BRAIN WITHOUT AND WITH CONTRAST 05/23/2024 02:09:00 PM  TECHNIQUE: Multiplanar multisequence MRI of the head/brain was  performed with and without the administration of intravenous contrast.  COMPARISON: None available.  CLINICAL HISTORY: Neuro deficit, neurodegenerative disorder suspected; known lesion of brain without follow up. Right leg weakness, dizziness.  FINDINGS:  BRAIN AND VENTRICLES: There is a 4.4 x 3.1 x 2.9 cm cystic focus within the left aspect of the posterior fossa. This cystic focus freely communicates with the fourth ventricle. The vermis is hypoplastic, particularly along the left anterior aspect. The left cerebellar hemisphere is also hypoplastic and is displaced anteriorly within the posterior fossa. The right cerebellar hemisphere is unremarkable in appearance. There is no hydrocephalus. No associated solid or enhancing component. No diffusion signal abnormality or evidence of acute infarct. The white matter is unremarkable. No edema, mass effect, or midline shift in the supratentorial parenchyma. The basilar cisterns are patent. There is a 1.0 x 0.5 x 0.9 cm enhancing extraaxial focus along the posterior falx, suggestive of meningioma, with a small adjacent enhancing dural tail seen on series 18 image 99 and series 19 image 9.  ORBITS: No acute abnormality.  SINUSES: No acute abnormality.  BONES AND SOFT TISSUES: Normal bone marrow signal and enhancement. No acute soft tissue abnormality. The calvarium is unremarkable.  IMPRESSION: 1. 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 No associated solid or enhancing component. 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 2. Enhancing extra-axial focus along the posterior falx, suggestive of meningioma.  Electronically signed by: Donnice Mania MD 05/23/2024 03:39 PM EDT RP Workstation: [id number]

## 2024-07-02 ENCOUNTER — Encounter: Payer: Self-pay | Admitting: Neurology

## 2024-09-13 NOTE — Progress Notes (Addendum)
 NEUROLOGY CONSULTATION NOTE  Adam Kaiser MRN: [id number] DOB: [birth year]  Referring provider: Dino Sable, MD Primary care provider: Lynwood Crandall, NP  Reason for consult:  headache  Assessment/Plan:   Cognitive changes/brain fog Subjective muscle weakness/objective weakness not appreciated on exam Paresthesias Myalgias/polyarthralgia Depression  Some of his symptoms may be due to low B12.  While his B12 level is within normal range, levels less than 400 may present with symptoms consistent with B12 deficiency.  Underlying depression may be playing a role and contributing to physical manifestations.  Otherwise, this is likely secondary to a systemic etiology Check Mg, CK and aldolase to evaluate for potential underlying myopathy and cause of muscle spasms Recommended to start taking B12 1000mcg daily.  Recommended following up with PCP to discuss management of underlying depression Follow up in 6 to 7 months.  Total time spent on today's visit was 52 minutes dedicated to this patient today, preparing to see patient, examining the patient, ordering tests and/or medications and counseling the patient, documenting clinical information in the EHR or other health record, independently interpreting results and communicating results to the patient/family, discussing treatment and goals, answering patient's questions and coordinating care.    Subjective:   Discussed the use of AI scribe software for clinical note transcription with the patient, who gave verbal consent to proceed.  History of Present Illness Adam Kaiser is a 48 year old male who presents with headaches and memory problems. He is accompanied by his wife. He was referred by a neurosurgeon for evaluation of a brain cyst and memory issues.  History supplemented by neurosurgery and PCP notes as well as his accompanying wife.   He has been experiencing headaches for approximately ten years, initially light and infrequent,  about once a month, now almost daily. The headaches are diffuse, sometimes feeling like pressure or swelling in the brain, lasting from five minutes to eight hours. They are exacerbated by heat and sun exposure, often reaching a severity of nine out of ten. He takes ibuprofen  or Goody powder for relief, using them at least ten days a month. No associated nausea, photophobia, or phonophobia, or visual disturbance but he experiences nausea separately during the day.  For the past couple of years, he has been experiencing memory problems, including difficulty finding words, indecisiveness, and forgetting conversations. He sometimes misinterprets written information and struggles with relaying conversations to others. His wife notes increased irritability and inappropriate language use in the last couple of years. He notes brain fog.  There is a family history of Alzheimer's in his grandmother and cerebral amyloid angiopathy in his mother, who were both diagnosed in their 65.  He reports muscle fatigue and joint pain for the past five years, with constant aching, twitching, and spasms, particularly in the arms and legs. He experiences weakness in grip, frequent dropping of objects, and difficulty with tasks like writing and chewing. He also reports numbness in the legs, balance issues, and altered sensation in the feet, such as difficulty differentiating hot from cold water on his toes when taking a shower.  While he did have COVID and symptoms worsened afterward, symptoms began shortly prior to COVID.    He experiences depression, feeling 'like crap all the time,' with fatigue, poor sleep, and a general sense of malaise. He sleeps about four hours a night and feels tired upon waking. He has not sought treatment for depression.  Labs from 05/23/2024 include negative ANA, negative RF, non-reactive Hep C antibody, HGB A1c 5.9, TSH  2.10, and B12 365 and low vit D of 26.21.  He started vit D supplementation  without improvement.  He has not taken B12 supplements.  MRI of brain with and without contrast on 05/23/2024 showed plasia of the left cerebellar hemisphere, likely congenital, likely incidental finding and not requiring any intervention.  Also a small meningioma noted as well along the posterior falx.   05/23/2024 MRI BRAIN W WO:  1. 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 No associated solid or enhancing component. 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 2. Enhancing extra-axial focus along the posterior falx, suggestive of meningioma.  PAST MEDICAL HISTORY: Past Medical History:  Diagnosis Date   Heart murmur    Herpetic lesion     PAST SURGICAL HISTORY: No past surgical history on file.  MEDICATIONS: Current Outpatient Medications on File Prior to Visit  Medication Sig Dispense Refill   hydrocortisone  2.5 % cream Apply 1 Application topically 2 (two) times daily. 30 g 0   No current facility-administered medications on file prior to visit.    ALLERGIES: No Known Allergies  FAMILY HISTORY: Family History  Problem Relation Age of Onset   Neurologic Disorder Mother    COPD Father    Heart disease Brother     Objective:  Blood pressure 111/77, pulse 93, height 6' (1.829 m), weight 233 lb 9.6 oz (106 kg), SpO2 96%. General: No acute distress.  Patient appears well-groomed.   Head:  Normocephalic/atraumatic Eyes:  fundi examined but not visualized Neck: supple, no paraspinal tenderness, full range of motion Heart: regular rate and rhythm Neurological Exam: Mental status: alert and oriented to person, place, and time, speech fluent and not dysarthric, language intact. Cranial nerves: CN I: not tested CN II: pupils equal, round and reactive to light, visual fields intact CN III, IV, VI:  full range of motion, no nystagmus, no ptosis CN V: facial  sensation intact. CN VII: upper and lower face symmetric CN VIII: hearing intact CN IX, X: gag intact, uvula midline CN XI: sternocleidomastoid and trapezius muscles intact CN XII: tongue midline Bulk & Tone: normal, no fasciculations. Motor:  muscle strength 5/5 throughout Sensation:  Pinprick and vibratory sensation intact. Deep Tendon Reflexes:  2+ throughout,  toes downgoing.   Finger to nose testing:  Without dysmetria.   Gait:  Normal station and stride.  Romberg negative.    Thank you for allowing me to take part in the care of this patient.  Juliene Dunnings, DO  CC:  Lynwood Crandall, NP  Dino Sable, MD

## 2024-09-17 ENCOUNTER — Other Ambulatory Visit

## 2024-09-17 ENCOUNTER — Ambulatory Visit: Admitting: Neurology

## 2024-09-17 ENCOUNTER — Encounter: Payer: Self-pay | Admitting: Neurology

## 2024-09-17 VITALS — BP 111/77 | HR 93 | Ht 72.0 in | Wt 233.6 lb

## 2024-09-17 DIAGNOSIS — M6281 Muscle weakness (generalized): Secondary | ICD-10-CM

## 2024-09-17 DIAGNOSIS — R4189 Other symptoms and signs involving cognitive functions and awareness: Secondary | ICD-10-CM

## 2024-09-17 DIAGNOSIS — R202 Paresthesia of skin: Secondary | ICD-10-CM | POA: Diagnosis not present

## 2024-09-17 NOTE — Patient Instructions (Signed)
 Start B12 1000mcg daily Check CK, Mg and aldolase Recommend following up with your PCP to address treating depression If there is concern about sleep apnea, let me know and we can refer for evaluation Limit use of pain relievers to no more than 9 days out of the month to prevent risk of rebound or medication-overuse headache. Caffeine cessaton Routine exercise Follow up 6-7 months.

## 2024-09-18 LAB — MAGNESIUM: Magnesium: 2.3 mg/dL (ref 1.5–2.5)

## 2024-09-19 LAB — CK: Total CK: 54 U/L (ref 26–366)

## 2024-09-19 LAB — ALDOLASE: Aldolase: 4.9 U/L (ref <=8.1)

## 2024-09-20 ENCOUNTER — Ambulatory Visit: Payer: Self-pay | Admitting: Neurology

## 2024-09-20 NOTE — Progress Notes (Signed)
Tried calling patient no answer. LMOVM to call the office back.

## 2024-09-21 NOTE — Telephone Encounter (Signed)
 Pt called in this afternoon, and he is  returning a call. Thanks

## 2024-09-21 NOTE — Progress Notes (Signed)
 Tried calling patient no answer.

## 2024-09-25 NOTE — Progress Notes (Signed)
 Patient advised of results.

## 2024-10-22 ENCOUNTER — Other Ambulatory Visit (HOSPITAL_COMMUNITY): Payer: Self-pay

## 2024-10-22 MED ORDER — FLUOROMETHOLONE 0.1 % OP SUSP
1.0000 [drp] | Freq: Four times a day (QID) | OPHTHALMIC | 0 refills | Status: AC
  Filled 2024-10-22: qty 5, 25d supply, fill #0

## 2025-03-27 ENCOUNTER — Ambulatory Visit: Admitting: Neurology

## 2025-06-04 ENCOUNTER — Ambulatory Visit: Admitting: Neurosurgery
